# Patient Record
Sex: Male | Born: 1990 | Race: White | Hispanic: No | Marital: Single | Smoking: Current every day smoker
Health system: Southern US, Community
[De-identification: ages and names within clinical notes are randomized; demographics above are authoritative.]

## PROBLEM LIST (undated history)

## (undated) DIAGNOSIS — R569 Unspecified convulsions: Secondary | ICD-10-CM

## (undated) HISTORY — PX: APPENDECTOMY: SHX54

---
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
## 2010-11-16 ENCOUNTER — Emergency Department: Payer: Self-pay | Admitting: Internal Medicine

## 2013-11-25 ENCOUNTER — Emergency Department: Payer: Self-pay | Admitting: Emergency Medicine

## 2014-11-10 ENCOUNTER — Emergency Department (HOSPITAL_COMMUNITY)
Admission: EM | Admit: 2014-11-10 | Discharge: 2014-11-10 | Disposition: A | Discharge status: Home / Self Care | Payer: Self-pay | Attending: Emergency Medicine | Admitting: Emergency Medicine

## 2014-11-10 ENCOUNTER — Encounter (HOSPITAL_COMMUNITY): Payer: Self-pay | Admitting: *Deleted

## 2014-11-10 DIAGNOSIS — Z720 Tobacco use: Secondary | ICD-10-CM | POA: Insufficient documentation

## 2014-11-10 DIAGNOSIS — K047 Periapical abscess without sinus: Secondary | ICD-10-CM | POA: Insufficient documentation

## 2014-11-10 MED ORDER — PENICILLIN V POTASSIUM 500 MG PO TABS: 500.0000 mg | ORAL_TABLET | Freq: Four times a day (QID) | ORAL | Status: AC

## 2014-11-10 MED ORDER — IBUPROFEN 800 MG PO TABS: 800.0000 mg | ORAL_TABLET | Freq: Three times a day (TID) | ORAL | Status: DC | PRN

## 2014-11-10 MED ORDER — HYDROCODONE-ACETAMINOPHEN 5-325 MG PO TABS: 1.0000 | ORAL_TABLET | ORAL | Status: DC | PRN

## 2014-11-10 MED ORDER — HYDROCODONE-ACETAMINOPHEN 5-325 MG PO TABS
1.0000 | ORAL_TABLET | Freq: Once | ORAL | Status: AC
  Administered 2014-11-10: 1 via ORAL
  Filled 2014-11-10: qty 1

## 2014-11-10 NOTE — ED Notes (Signed)
Pt reports he has several broken teeth on Lt side

## 2014-11-10 NOTE — ED Provider Notes (Signed)
CSN: [id number]     Arrival date & time 11/10/14  1432 History  This chart was scribed for non-physician practitioner, Trixie DredgeEmily Meziah Blasingame, PA-C, working with Samuel JesterKathleen McManus, DO, by Modena JanskyAlbert Thayil, ED Scribe. This patient was seen in room TR07C/TR07C and the patient's care was started at 3:25 PM.   Chief Complaint  Patient presents with  . Dental Pain   The history is provided by the patient. No language interpreter was used.   HPI Comments: Benedetto GoadCorey L Tramble is a 24 y.o. male who presents to the Emergency Department complaining of constant moderate left sided dental pain that started 2 days ago. 7/10 intensity He states that the pain started 2 days ago, which he was treating with tylenol and ibuprofen. He reports that the pain worsening this morning and he noted left sided facial swelling.  Has had broken teeth in that area remotely.  Has had subjective fevers and myalgias.  Denies sore throat, difficulty swallowing or breathing.     History reviewed. No pertinent past medical history. History reviewed. No pertinent past surgical history. History reviewed. No pertinent family history. History  Substance Use Topics  . Smoking status: Current Every Day Smoker    Types: Cigarettes  . Smokeless tobacco: Never Used  . Alcohol Use: No    Review of Systems  Constitutional: Positive for fever. Negative for chills.  HENT: Positive for dental problem. Negative for sore throat and trouble swallowing.   Respiratory: Negative for shortness of breath.   Musculoskeletal: Positive for myalgias.  Skin: Negative for color change and wound.  Allergic/Immunologic: Negative for immunocompromised state.  Hematological: Does not bruise/bleed easily.  Psychiatric/Behavioral: Negative for self-injury.    Allergies  Review of patient's allergies indicates not on file.  Home Medications   Prior to Admission medications   Not on File   BP 135/82 mmHg  Temp(Src) 98.2 F (36.8 C) (Oral)  Resp 12  Ht 5\' 9"   (1.753 m)  Wt 135 lb (61.236 kg)  BMI 19.93 kg/m2 Physical Exam  Constitutional: He appears well-developed and well-nourished. No distress.  HENT:  Head: Normocephalic and atraumatic.  Mouth/Throat: Uvula is midline and oropharynx is clear and moist. Mucous membranes are not dry. No uvula swelling. No oropharyngeal exudate, posterior oropharyngeal edema, posterior oropharyngeal erythema or tonsillar abscesses.  Mouth: TTP over several teeth on the left lower side, palplable area of induration and tenderness adjacent to that side.   Neck: Normal range of motion. Neck supple.  Cardiovascular: Normal rate.   Pulmonary/Chest: Effort normal and breath sounds normal. No stridor.  Lymphadenopathy:    He has no cervical adenopathy.  Neurological: He is alert.  Skin: He is not diaphoretic.  Nursing note and vitals reviewed.   ED Course  Procedures (including critical care time) DIAGNOSTIC STUDIES:   COORDINATION OF CARE: 3:29 PM- Pt advised of plan for treatment which includes medication and pt agrees.  Labs Review Labs Reviewed - No data to display  Imaging Review No results found.   EKG Interpretation None      MDM   Final diagnoses:  Dental abscess    Afebrile, nontoxic patient with new dental pain with obvious abscess. No airway concerns. Doubt Ludwig's angina.  D/C home with antibiotic, pain medication and dental follow up.  Discussed findings, treatment, and follow up  with patient.  Pt given return precautions.  Pt verbalizes understanding and agrees with plan.       I personally performed the services described in this documentation, which was  scribed in my presence. The recorded information has been reviewed and is accurate.     Trixie Dredge, PA-C 11/10/14 1550  Samuel Jester, DO 11/11/14 1336

## 2014-11-10 NOTE — Discharge Instructions (Signed)
Read the information below.  Use the prescribed medication as directed.  Please discuss all new medications with your pharmacist.  Do not take additional tylenol while taking the prescribed pain medication to avoid overdose.  You may return to the Emergency Department at any time for worsening condition or any new symptoms that concern you.  Please call the dentist listed above within 48 hours to schedule a close follow up appointment.  If you develop fevers, swelling in your face, difficulty swallowing or breathing, return to the ER immediately for a recheck.   ° ° °Dental Abscess °A dental abscess is a collection of infected fluid (pus) from a bacterial infection in the inner part of the tooth (pulp). It usually occurs at the end of the tooth's root.  °CAUSES  °· Severe tooth decay. °· Trauma to the tooth that allows bacteria to enter into the pulp, such as a broken or chipped tooth. °SYMPTOMS  °· Severe pain in and around the infected tooth. °· Swelling and redness around the abscessed tooth or in the mouth or face. °· Tenderness. °· Pus drainage. °· Bad breath. °· Bitter taste in the mouth. °· Difficulty swallowing. °· Difficulty opening the mouth. °· Nausea. °· Vomiting. °· Chills. °· Swollen neck glands. °DIAGNOSIS  °· A medical and dental history will be taken. °· An examination will be performed by tapping on the abscessed tooth. °· X-rays may be taken of the tooth to identify the abscess. °TREATMENT °The goal of treatment is to eliminate the infection. You may be prescribed antibiotic medicine to stop the infection from spreading. A root canal may be performed to save the tooth. If the tooth cannot be saved, it may be pulled (extracted) and the abscess may be drained.  °HOME CARE INSTRUCTIONS °· Only take over-the-counter or prescription medicines for pain, fever, or discomfort as directed by your caregiver. °· Rinse your mouth (gargle) often with salt water (¼ tsp salt in 8 oz [250 ml] of warm water) to  relieve pain or swelling. °· Do not drive after taking pain medicine (narcotics). °· Do not apply heat to the outside of your face. °· Return to your dentist for further treatment as directed. °SEEK MEDICAL CARE IF: °· Your pain is not helped by medicine. °· Your pain is getting worse instead of better. °SEEK IMMEDIATE MEDICAL CARE IF: °· You have a fever or persistent symptoms for more than 2-3 days. °· You have a fever and your symptoms suddenly get worse. °· You have chills or a very bad headache. °· You have problems breathing or swallowing. °· You have trouble opening your mouth. °· You have swelling in the neck or around the eye. °Document Released: 08/09/2005 Document Revised: 05/03/2012 Document Reviewed: 11/17/2010 °ExitCare® Patient Information ©2015 [employer]. This information is not intended to replace advice given to you by your health care provider. Make sure you discuss any questions you have with your health care provider. ° ° °Emergency Department Resource Guide °1) Find a Doctor and Pay Out of Pocket °Although you won't have to find out who is covered by your insurance plan, it is a good idea to ask around and get recommendations. You will then need to call the office and see if the doctor you have chosen will accept you as a new patient and what types of options they offer for patients who are self-pay. Some doctors offer discounts or will set up payment plans for their patients who do not have insurance, but you   will need to ask so you aren't surprised when you get to your appointment. ° °2) Contact Your Local Health Department °Not all health departments have doctors that can see patients for sick visits, but many do, so it is worth a call to see if yours does. If you don't know where your local health department is, you can check in your phone book. The CDC also has a tool to help you locate your state's health department, and many state websites also have listings of all of their local  health departments. ° °3) Find a Walk-in Clinic °If your illness is not likely to be very severe or complicated, you may want to try a walk in clinic. These are popping up all over the country in pharmacies, drugstores, and shopping centers. They're usually staffed by nurse practitioners or physician assistants that have been trained to treat common illnesses and complaints. They're usually fairly quick and inexpensive. However, if you have serious medical issues or chronic medical problems, these are probably not your best option. ° °No Primary Care Doctor: °- Call [facility] at  832-8000 - they can help you locate a primary care doctor that  accepts your insurance, provides certain services, etc. °- Physician Referral Service- 1-800-533-3463 ° °Chronic Pain Problems: °Organization         Address  Phone   Notes  °[facility]  (336) 297-2271 Patients need to be referred by their primary care doctor.  ° °Medication Assistance: °Organization         Address  Phone   Notes  °[place] [facility] [address] °[place], [place] [address] (336) 641-8030 --Must be a resident of [place] °-- Must have NO insurance coverage whatsoever (no Medicaid/ Medicare, etc.) °-- The pt. MUST have a primary care doctor that directs their care regularly and follows them in the community °  °[facility]  (866) 331-1348   °[employer]  (888) 892-1162   ° °Agencies that provide inexpensive medical care: °Organization         Address  Phone   Notes  °[facility]  (336) 832-8035   °[facility]    (336) 832-7272   °[facility] [address] °[place], [place] [address] (336) 832-4777   °[facility] [address], °[place] (336) 271-4999   °[facility]    (336) 373-0678   °[facility]    (336) 272-1050   °[facility] ° [address], [place] Phone:   (336) 832-4444, Fax:  (336) 832-4440 Hours of Operation:  9 am - 6 pm, M-F.  Also accepts Medicaid/Medicare and self-pay.  °[facility] ° [address], [place] Phone: (336) 832-3150, Fax: (336) 832-3151. Hours of Operation:  8:30 am - 5:30 pm, M-F.  Also accepts Medicaid and self-pay.  °[facility] [place] [address], [place] Phone: (336) 878-6027   °[facility] [address], [place], [place] (336)723-1848, Ext. 123 Mondays & Thursdays: 7-9 AM.  First 15 patients are seen on a first come, first serve basis. °  ° °Medicaid-accepting [place] Providers: ° °Organization         Address  Phone   Notes  °[facility] [address], [place] (336) 641-2100 Also accepts self-pay patients.  °[facility] [address],   [place] ° (336) 856-9996   °[facility] [address], [place] (336) 288-8857   °[facility] [address] [place] [address], [place] (336) 299-7000   °Veita Bland [address], [place]  ° (336) 373-1557 Only accepts [place] Access Medicaid patients after they have their name applied to their card.  ° °Self-Pay (no insurance) in [place]: ° °Organization         Address  Phone   Notes  °Sickle Cell Patients, [facility] [address], [place] (336) 832-1970   °[facility] [address], [place] (336) 832-4400   ° [facility] ° [address], [place] (336) 992-4800   °[facility]/Dr. Osei-Bonsu ° [address] [place] [address], [place] or [address], [place] (336) 841-8500 Phone number for both [place] and [place] locations is the same.  °[facility] [address], [place] (336) 299-0000   °[facility] [address] [place] [address], [place] or [address] (336)  852-7530 °(336) 878-2260   °[facility] [address], [place] (336) 350-1642, phone; (336) 294-5005, fax Sees patients 1st and 3rd Saturday of every month.  Must not qualify for public or private insurance (i.e. Medicaid, Medicare, [place] Health Choice, Veterans' Benefits) • Household income should be no more than 200% of the poverty level •The clinic cannot treat you if you are pregnant or think you are pregnant • Sexually transmitted diseases are not treated at the clinic.  ° ° °Dental Care: °Organization         Address  Phone  Notes  °[facility] [address], [place] (336) 641-6152 Accepts children up to age 21 who are enrolled in Medicaid or [place] Health Choice; pregnant women with a Medicaid card; and children who have applied for Medicaid or [place] Health Choice, but were declined, whose parents can pay a reduced fee at time of service.  °[facility] [place]  [address], [place] (336) 641-7733 Accepts children up to age 21 who are enrolled in Medicaid or [place] Health Choice; pregnant women with a Medicaid card; and children who have applied for Medicaid or [place] Health Choice, but were declined, whose parents can pay a reduced fee at time of service.  °[facility] ° [address], [place] (336) 641-4533 Patients are seen by appointment only. Walk-ins are not accepted. [facility] will see patients 18 years of age and older. °Monday - Tuesday (8am-5pm) °Most Wednesdays (8:30-5pm) °$30 per visit, cash only  °[facility] ° [address], [place] (336) 641-4533 Patients are seen by appointment only. Walk-ins are not accepted. [facility] will see patients 18 years of age and older. °One Wednesday Evening (Monthly: Volunteer Based).  $30 per visit, cash only  °[facility]  (919) 537-3737 for adults;  Children under age 4, call Graduate Pediatric Dentistry at (919) [phone]. Children aged 4-14, please call (919) [phone] to request a pediatric application. ° Dental services are provided in all areas of dental care including fillings, crowns and bridges, complete and partial dentures, implants, gum treatment, root canals, and extractions. Preventive care is also provided. Treatment is provided to both adults and children. °Patients are selected via a lottery and   there is often a waiting list. °  °[facility] [address], °[place] ° (336) 763-8833 [email or web address] °  °[facility] [address], [place], [place] (336)723-1848, Ext. 123 Second and Fourth Thursday of each month, opens at 6:30 AM; Clinic ends at 9 AM.  Patients are seen on a first-come first-served basis, and a limited number are seen during each clinic.  ° °[facility] ° [address], [place], [place] (336) 723-7904   Eligibility Requirements °You must have lived in [place], [place], or [place] counties for at least the last three months. °  You cannot be eligible for state or federal sponsored healthcare insurance, including Veterans Administration, Medicaid, or Medicare. °  You generally cannot be eligible for healthcare insurance through your employer.  °  How to apply: °Eligibility screenings are held every Tuesday and Wednesday afternoon from 1:00 pm until 4:00 pm. You do not need an appointment for the interview!  °[facility] [address], [place], [place] 336-631-2330   °[facility]  336-342-8273   °[facility]  336-703-3100   °[facility]  336-570-6415   ° °Behavioral Health Resources in the Community: °Intensive Outpatient Programs °Organization         Address  Phone  Notes  °[facility] [address], [place], [place] 336-878-6098   °[facility] [address], [place], [place] 336-832-9800   °[facility] [address], [place], [place] ° 336-882-2125   °[facility] [address],  °[place], [place] 1-800-853-5163 or 336-641-4981   °Substance Abuse Resources °Organization         Address  Phone  Notes  °[facility]  336-882-2125   °[facility]  336-784-9470   °[facility]  336-285-9073   °[facility]  336-845-3988   °Residential & Outpatient Substance Abuse Program  1-800-659-3381   °Psychological Services °Organization         Address  Phone  Notes  °[facility]  336- 832-9600   °[facility]  336- 378-7881   °[facility] [address], [place] 1-800-853-5163 or 336-641-4981   ° °Mobile Crisis Teams °Organization         Address  Phone  Notes  °[facility], Mobile Crisis Care Unit  1-877-626-1772   °Assertive °[facility] ° [address] [place], [place] 336-834-9664   °Sharon DeEsch [address] °[place] 336-554-5454   ° °Self-Help/Support Groups °Organization         Address  Phone             Notes  °[facility] - variety of support groups  336- 373-1402 Call for more information  °Narcotics Anonymous (NA), [facility] [address], °[place]  2 meetings at this location  ° °Residential Treatment Programs °Organization         Address  Phone  Notes  °[facility] [address],    °[place]  1-866-801-8205   °[facility] ° [address], [place], [place] 704-293-8524   °[facility] [address], [place] 336-845-3988 Admissions: 8am-3pm M-F  °[facility] [address]    °[place], [place] 336-841-1104   °[facility] [address], [place], [place] 336-379-7146   °  [facility] [address]  °[place], [place] 336-285-9073   °[facility] - Intensive  Outpatient [address], [place], [place] 336-852-3033   °[facility] [address]  °[place], [place] 1-877-615-2722 or 336-784-9470   °[facility] [address] [place], [place] 336-227-7417 Accepts Medicaid  °[facility] [address]  °[place] 1-800-659-3381 Substance Abuse/Addiction Treatment  ° °[place] Behavioral Health Resources °Organization         Address  Phone  Notes  °[facility]  (888) 581-9988   °Julie Brannon, PhD [address] [place], [place]   (336) 349-5553 or (336) 951-0000   °[facility]   [address] °[place], [place] (336) 349-4454   °[facility] [address], [place], [place] (336) 342-8316 Insurance/Medicaid/sponsorship through [facility]  °[facility] [address]                                    [place], [place] (336) 342-8316 Therapy/tele-psych/case  °[facility] [address]  ° [place], [place] (336) 349-2233    °Dr. Arfeen  (336) 349-4544   °[facility] [place]  [employer] [place] [facility] 1) [address], [place] °2) [address], [place] °3)  [address], [place] (336) 349-3220 °(336) 342-7768 ° °(336) 342-8140   °[place] [facility] (336) 342-1394 or (336) 342-3537 (After Hours)    ° ° ° ° °

## 2014-11-10 NOTE — ED Notes (Signed)
Declined W/C at D/C and was escorted to lobby by RN. 

## 2014-12-14 ENCOUNTER — Emergency Department
Admit: 2014-12-14 | Disposition: A | Discharge status: Home / Self Care | Payer: Self-pay | Admitting: Emergency Medicine

## 2017-09-13 ENCOUNTER — Emergency Department
Admission: EM | Admit: 2017-09-13 | Discharge: 2017-09-13 | Discharge status: Against Medical Advice | Payer: Self-pay | Attending: Emergency Medicine | Admitting: Emergency Medicine

## 2017-09-13 ENCOUNTER — Emergency Department: Payer: Self-pay

## 2017-09-13 ENCOUNTER — Encounter: Payer: Self-pay | Admitting: Emergency Medicine

## 2017-09-13 DIAGNOSIS — Z5321 Procedure and treatment not carried out due to patient leaving prior to being seen by health care provider: Secondary | ICD-10-CM | POA: Insufficient documentation

## 2017-09-13 DIAGNOSIS — R079 Chest pain, unspecified: Secondary | ICD-10-CM | POA: Insufficient documentation

## 2017-09-13 LAB — BASIC METABOLIC PANEL
Anion gap: 10 (ref 5–15)
BUN: 8 mg/dL (ref 6–20)
CALCIUM: 9.4 mg/dL (ref 8.9–10.3)
CHLORIDE: 100 mmol/L — AB (ref 101–111)
CO2: 25 mmol/L (ref 22–32)
CREATININE: 0.85 mg/dL (ref 0.61–1.24)
GFR calc non Af Amer: >60 mL/min (ref >60)
Glucose, Bld: 90 mg/dL (ref 65–99)
Potassium: 4 mmol/L (ref 3.5–5.1)
Sodium: 135 mmol/L (ref 135–145)

## 2017-09-13 LAB — TROPONIN I: Troponin I: <0.03 ng/mL (ref <0.03)

## 2017-09-13 LAB — CBC
HCT: 47.4 % (ref 40.0–52.0)
HEMOGLOBIN: 16.4 g/dL (ref 13.0–18.0)
MCH: 31.2 pg (ref 26.0–34.0)
MCHC: 34.7 g/dL (ref 32.0–36.0)
MCV: 89.9 fL (ref 80.0–100.0)
Platelets: 197 10*3/uL (ref 150–440)
RBC: 5.27 MIL/uL (ref 4.40–5.90)
RDW: 14 % (ref 11.5–14.5)
WBC: 10.3 10*3/uL (ref 3.8–10.6)

## 2017-09-13 NOTE — ED Notes (Signed)
Pt reports leaving now; pt informed that he needs to stay and be evaluated and that he will be going to the next available exam room; pt cont to stay he is leaving; st his friend is in an exam room and being discharge and is leaving now with him; instr to return for new or worsening symptoms

## 2017-09-13 NOTE — ED Triage Notes (Signed)
Pt presents with chest pain since last night. He and a friend injected meth last night. He now has chest pain, neck pain, flank pain. Pt also reports that his eyes are swollen. Pt alert & oriented with NAD noted.

## 2017-09-14 ENCOUNTER — Telehealth: Payer: Self-pay | Admitting: Emergency Medicine

## 2017-09-14 NOTE — Telephone Encounter (Signed)
Called patient due to lwot to inquire about condition and follow up plans. Number is disconnected and second number sounds like a fax

## 2020-03-10 ENCOUNTER — Emergency Department
Admission: EM | Admit: 2020-03-10 | Discharge: 2020-03-10 | Disposition: A | Discharge status: Home / Self Care | Payer: Self-pay | Attending: Emergency Medicine | Admitting: Emergency Medicine

## 2020-03-10 ENCOUNTER — Other Ambulatory Visit: Payer: Self-pay

## 2020-03-10 DIAGNOSIS — Z0000 Encounter for general adult medical examination without abnormal findings: Secondary | ICD-10-CM | POA: Insufficient documentation

## 2020-03-10 DIAGNOSIS — Z5321 Procedure and treatment not carried out due to patient leaving prior to being seen by health care provider: Secondary | ICD-10-CM | POA: Insufficient documentation

## 2020-03-10 LAB — COMPREHENSIVE METABOLIC PANEL
ALT: 58 U/L — ABNORMAL HIGH (ref 0–44)
AST: 33 U/L (ref 15–41)
Albumin: 5.2 g/dL — ABNORMAL HIGH (ref 3.5–5.0)
Alkaline Phosphatase: 95 U/L (ref 38–126)
Anion gap: 17 — ABNORMAL HIGH (ref 5–15)
BUN: 25 mg/dL — ABNORMAL HIGH (ref 6–20)
CO2: 20 mmol/L — ABNORMAL LOW (ref 22–32)
Calcium: 9.7 mg/dL (ref 8.9–10.3)
Chloride: 100 mmol/L (ref 98–111)
Creatinine, Ser: 1.31 mg/dL — ABNORMAL HIGH (ref 0.61–1.24)
GFR calc Af Amer: >60 mL/min (ref >60)
GFR calc non Af Amer: >60 mL/min (ref >60)
Glucose, Bld: 80 mg/dL (ref 70–99)
Potassium: 4.5 mmol/L (ref 3.5–5.1)
Sodium: 137 mmol/L (ref 135–145)
Total Bilirubin: 2.2 mg/dL — ABNORMAL HIGH (ref 0.3–1.2)
Total Protein: 8.3 g/dL — ABNORMAL HIGH (ref 6.5–8.1)

## 2020-03-10 LAB — ETHANOL: Alcohol, Ethyl (B): <10 mg/dL (ref <10)

## 2020-03-10 LAB — CBC
HCT: 49.3 % (ref 39.0–52.0)
Hemoglobin: 16.6 g/dL (ref 13.0–17.0)
MCH: 31 pg (ref 26.0–34.0)
MCHC: 33.7 g/dL (ref 30.0–36.0)
MCV: 92.1 fL (ref 80.0–100.0)
Platelets: 275 10*3/uL (ref 150–400)
RBC: 5.35 MIL/uL (ref 4.22–5.81)
RDW: 13.2 % (ref 11.5–15.5)
WBC: 22.4 10*3/uL — ABNORMAL HIGH (ref 4.0–10.5)
nRBC: 0 % (ref 0.0–0.2)

## 2020-03-10 NOTE — ED Notes (Signed)
Pt called from lobby to be taken back to exam room. Located pt walking across the parking lot. Pt name called loudly but he continued walking after turning around and acknowledging staff.

## 2020-03-10 NOTE — ED Triage Notes (Signed)
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 Denies SI/HI/AVH. Last used this morning around 2am.

## 2020-03-10 NOTE — ED Notes (Signed)
Pt voluntary and does not need to be dressed out at this time. First Nurse RN aware.

## 2020-03-29 ENCOUNTER — Encounter: Payer: Self-pay | Admitting: Emergency Medicine

## 2020-03-29 ENCOUNTER — Emergency Department
Admission: EM | Admit: 2020-03-29 | Discharge: 2020-03-29 | Disposition: A | Discharge status: Home / Self Care | Payer: Self-pay | Attending: Emergency Medicine | Admitting: Emergency Medicine

## 2020-03-29 ENCOUNTER — Other Ambulatory Visit: Payer: Self-pay

## 2020-03-29 DIAGNOSIS — N4829 Other inflammatory disorders of penis: Secondary | ICD-10-CM | POA: Insufficient documentation

## 2020-03-29 DIAGNOSIS — N4889 Other specified disorders of penis: Secondary | ICD-10-CM

## 2020-03-29 DIAGNOSIS — F17210 Nicotine dependence, cigarettes, uncomplicated: Secondary | ICD-10-CM | POA: Insufficient documentation

## 2020-03-29 LAB — CHLAMYDIA/NGC RT PCR (ARMC ONLY)
Chlamydia Tr: NOT DETECTED
N gonorrhoeae: NOT DETECTED

## 2020-03-29 LAB — URINALYSIS, COMPLETE (UACMP) WITH MICROSCOPIC
Bacteria, UA: NONE SEEN
Bilirubin Urine: NEGATIVE
Glucose, UA: NEGATIVE mg/dL
Hgb urine dipstick: NEGATIVE
Ketones, ur: NEGATIVE mg/dL
Leukocytes,Ua: NEGATIVE
Nitrite: NEGATIVE
Protein, ur: NEGATIVE mg/dL
Specific Gravity, Urine: 1.009 (ref 1.005–1.030)
pH: 7 (ref 5.0–8.0)

## 2020-03-29 MED ORDER — NEOSPORIN PLUS PAIN RELIEF MS 3.5-10000-10 EX CREA
TOPICAL_CREAM | Freq: Two times a day (BID) | CUTANEOUS | 0 refills | Status: AC
Start: 2020-03-29 — End: ?

## 2020-03-29 NOTE — ED Provider Notes (Signed)
[facility] Emergency Department Provider Note   ____________________________________________   First MD Initiated Contact with Patient 03/29/20 1229     (approximate)  I have reviewed the triage vital signs and the nursing notes.   HISTORY  Chief Complaint STD testing    HPI Noah Patel is a 29 y.o. male patient requests STD testing secondary to penile irritation.  Patient state secondary to to unprotected sexual encounters yesterday he awakened with redness to the penis.  Patient denies urethral discharge.  Patient denies dysuria or hematuria.  Patient denies pain.         History reviewed. No pertinent past medical history.  There are no problems to display for this patient.   History reviewed. No pertinent surgical history.  Prior to Admission medications   Medication Sig Start Date End Date Taking? Authorizing Provider  HYDROcodone-acetaminophen (NORCO/VICODIN) 5-325 MG per tablet Take 1 tablet by mouth every 4 (four) hours as needed for moderate pain or severe pain. 11/10/14   Trixie Dredge, PA-C  ibuprofen (ADVIL,MOTRIN) 800 MG tablet Take 1 tablet (800 mg total) by mouth every 8 (eight) hours as needed for mild pain or moderate pain. 11/10/14   Trixie Dredge, PA-C  neomycin-polymyxin-pramoxine (NEOSPORIN PLUS) 1 % cream Apply topically 2 (two) times daily. 03/29/20   Joni Reining, PA-C    Allergies Patient has no known allergies.  No family history on file.  Social History Social History   Tobacco Use  . Smoking status: Current Every Day Smoker    Packs/day: 1.00    Types: Cigarettes  . Smokeless tobacco: Never Used  Substance Use Topics  . Alcohol use: Yes    Alcohol/week: 24.0 standard drinks    Types: 24 Cans of beer per week  . Drug use: Yes    Types: Methamphetamines, IV    Comment: last used in July     Review of Systems Constitutional: No fever/chills Eyes: No visual changes. ENT: No sore throat. Cardiovascular:  Denies chest pain. Respiratory: Denies shortness of breath. Gastrointestinal: No abdominal pain.  No nausea, no vomiting.  No diarrhea.  No constipation. Genitourinary: Negative for dysuria. Musculoskeletal: Negative for back pain. Skin: Erythematous glans penis. Neurological: Negative for headaches, focal weakness or numbness.   ____________________________________________   PHYSICAL EXAM:  VITAL SIGNS: ED Triage Vitals  Enc Vitals Group     BP 03/29/20 1207 118/65     Pulse Rate 03/29/20 1207 82     Resp 03/29/20 1207 16     Temp 03/29/20 1207 98.7 F (37.1 C)     Temp Source 03/29/20 1207 Oral     SpO2 03/29/20 1207 100 %     Weight 03/29/20 1208 147 lb (66.7 kg)     Height 03/29/20 1208 5\' 9"  (1.753 m)     Head Circumference --      Peak Flow --      Pain Score 03/29/20 1208 0     Pain Loc --      Pain Edu? --      Excl. in GC? --    Constitutional: Alert and oriented. Well appearing and in no acute distress. Cardiovascular: Normal rate, regular rhythm. Grossly normal heart sounds.  Good peripheral circulation. Respiratory: Normal respiratory effort.  No retractions. Lungs CTAB. Genitourinary: No lesions or urethral discharge. Neurologic:  Normal speech and language. No gross focal neurologic deficits are appreciated. No gait instability. Skin: Mild erythematous to the glans penis.   Psychiatric: Mood and affect  are normal. Speech and behavior are normal.  ____________________________________________   LABS (all labs ordered are listed, but only abnormal results are displayed)  Labs Reviewed  URINALYSIS, COMPLETE (UACMP) WITH MICROSCOPIC - Abnormal; Notable for the following components:      Result Value   Color, Urine YELLOW (*)    APPearance CLEAR (*)    All other components within normal limits  CHLAMYDIA/NGC RT PCR ([facility] ONLY)  RPR    ____________________________________________  EKG   ____________________________________________  RADIOLOGY  ED MD interpretation:    Official radiology report(s): No results found.  ____________________________________________   PROCEDURES  Procedure(s) performed (including Critical Care):  Procedures   ____________________________________________   INITIAL IMPRESSION / ASSESSMENT AND PLAN / ED COURSE  As part of my medical decision making, I reviewed the following data within the electronic MEDICAL RECORD NUMBER     Patient presents with redness to the glans of the penis status post sexual activity yesterday.  No lesions or urethral discharge.  Urinalysis grossly unremarkable.  Results for chlamydia, gonorrhea, and syphilis are pending.  Patient advised he will be notified telephonically if test positive.  He may also seek further evaluation at the [facility] health department.  Advise applied Neosporin for irritation.    Noah Patel was evaluated in Emergency Department on 03/29/2020 for the symptoms described in the history of present illness. He was evaluated in the context of the global COVID-19 pandemic, which necessitated consideration that the patient might be at risk for infection with the SARS-CoV-2 virus that causes COVID-19. Institutional protocols and algorithms that pertain to the evaluation of patients at risk for COVID-19 are in a state of rapid change based on information released by regulatory bodies including the CDC and federal and state organizations. These policies and algorithms were followed during the patient's care in the ED.       ____________________________________________   FINAL CLINICAL IMPRESSION(S) / ED DIAGNOSES  Final diagnoses:  Penile irritation     ED Discharge Orders         Ordered    neomycin-polymyxin-pramoxine (NEOSPORIN PLUS) 1 % cream  2 times daily     Discontinue  Reprint     03/29/20 1332           Note:  This  document was prepared using Dragon voice recognition software and may include unintentional dictation errors.    Joni Reining, PA-C 03/29/20 1338    Gilles Chiquito, MD 03/29/20 [phone]

## 2020-03-29 NOTE — ED Triage Notes (Signed)
Pt to ED via POV stating that he wants to get tested for STD. Pt denies penile discharge but states that his penis is red. Pt is in NAD.

## 2020-03-29 NOTE — Discharge Instructions (Addendum)
Your screening for STD was unremarkable.  Test results are still pending.  Advised to apply Neosporin gland the penis twice a day for 3 to 5 days.  Advised use of a condom for sexual activity until condition clears.  Any positive test will be telephonically relayed to you.

## 2020-03-30 LAB — RPR: RPR Ser Ql: NONREACTIVE

## 2020-04-27 ENCOUNTER — Other Ambulatory Visit: Payer: Self-pay

## 2020-04-27 DIAGNOSIS — Y999 Unspecified external cause status: Secondary | ICD-10-CM | POA: Insufficient documentation

## 2020-04-27 DIAGNOSIS — M546 Pain in thoracic spine: Secondary | ICD-10-CM | POA: Insufficient documentation

## 2020-04-27 DIAGNOSIS — X58XXXA Exposure to other specified factors, initial encounter: Secondary | ICD-10-CM | POA: Insufficient documentation

## 2020-04-27 DIAGNOSIS — S069X9A Unspecified intracranial injury with loss of consciousness of unspecified duration, initial encounter: Secondary | ICD-10-CM | POA: Insufficient documentation

## 2020-04-27 DIAGNOSIS — R42 Dizziness and giddiness: Secondary | ICD-10-CM | POA: Insufficient documentation

## 2020-04-27 DIAGNOSIS — Z5321 Procedure and treatment not carried out due to patient leaving prior to being seen by health care provider: Secondary | ICD-10-CM | POA: Insufficient documentation

## 2020-04-27 DIAGNOSIS — Y939 Activity, unspecified: Secondary | ICD-10-CM | POA: Insufficient documentation

## 2020-04-27 DIAGNOSIS — Y9289 Other specified places as the place of occurrence of the external cause: Secondary | ICD-10-CM | POA: Insufficient documentation

## 2020-04-27 LAB — URINALYSIS, COMPLETE (UACMP) WITH MICROSCOPIC
Bacteria, UA: NONE SEEN
Bilirubin Urine: NEGATIVE
Glucose, UA: NEGATIVE mg/dL
Hgb urine dipstick: NEGATIVE
Ketones, ur: 20 mg/dL — AB
Leukocytes,Ua: NEGATIVE
Nitrite: NEGATIVE
Protein, ur: 100 mg/dL — AB
Specific Gravity, Urine: 1.021 (ref 1.005–1.030)
Squamous Epithelial / HPF: NONE SEEN (ref 0–5)
pH: 6 (ref 5.0–8.0)

## 2020-04-27 LAB — CBC
HCT: 48.8 % (ref 39.0–52.0)
Hemoglobin: 16.7 g/dL (ref 13.0–17.0)
MCH: 31.2 pg (ref 26.0–34.0)
MCHC: 34.2 g/dL (ref 30.0–36.0)
MCV: 91 fL (ref 80.0–100.0)
Platelets: 293 10*3/uL (ref 150–400)
RBC: 5.36 MIL/uL (ref 4.22–5.81)
RDW: 13 % (ref 11.5–15.5)
WBC: 17.8 10*3/uL — ABNORMAL HIGH (ref 4.0–10.5)
nRBC: 0 % (ref 0.0–0.2)

## 2020-04-27 LAB — BASIC METABOLIC PANEL
Anion gap: 10 (ref 5–15)
BUN: 9 mg/dL (ref 6–20)
CO2: 26 mmol/L (ref 22–32)
Calcium: 9.6 mg/dL (ref 8.9–10.3)
Chloride: 103 mmol/L (ref 98–111)
Creatinine, Ser: 0.72 mg/dL (ref 0.61–1.24)
GFR calc Af Amer: >60 mL/min (ref >60)
GFR calc non Af Amer: >60 mL/min (ref >60)
Glucose, Bld: 99 mg/dL (ref 70–99)
Potassium: 3.9 mmol/L (ref 3.5–5.1)
Sodium: 139 mmol/L (ref 135–145)

## 2020-04-27 NOTE — ED Triage Notes (Signed)
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

## 2020-04-28 ENCOUNTER — Emergency Department
Admission: EM | Admit: 2020-04-28 | Discharge: 2020-04-28 | Disposition: A | Discharge status: Home / Self Care | Payer: Self-pay | Attending: Emergency Medicine | Admitting: Emergency Medicine

## 2020-09-14 ENCOUNTER — Emergency Department
Admission: EM | Admit: 2020-09-14 | Discharge: 2020-09-14 | Disposition: A | Discharge status: Home / Self Care | Payer: Self-pay | Attending: Emergency Medicine | Admitting: Emergency Medicine

## 2020-09-14 ENCOUNTER — Emergency Department: Payer: Self-pay

## 2020-09-14 ENCOUNTER — Other Ambulatory Visit: Payer: Self-pay

## 2020-09-14 DIAGNOSIS — F13939 Sedative, hypnotic or anxiolytic use, unspecified with withdrawal, unspecified: Secondary | ICD-10-CM

## 2020-09-14 DIAGNOSIS — F13239 Sedative, hypnotic or anxiolytic dependence with withdrawal, unspecified: Secondary | ICD-10-CM | POA: Insufficient documentation

## 2020-09-14 DIAGNOSIS — F17210 Nicotine dependence, cigarettes, uncomplicated: Secondary | ICD-10-CM | POA: Insufficient documentation

## 2020-09-14 HISTORY — DX: Unspecified convulsions: R56.9

## 2020-09-14 LAB — CBC
HCT: 48.5 % (ref 39.0–52.0)
Hemoglobin: 16.3 g/dL (ref 13.0–17.0)
MCH: 31.1 pg (ref 26.0–34.0)
MCHC: 33.6 g/dL (ref 30.0–36.0)
MCV: 92.6 fL (ref 80.0–100.0)
Platelets: 256 10*3/uL (ref 150–400)
RBC: 5.24 MIL/uL (ref 4.22–5.81)
RDW: 13.2 % (ref 11.5–15.5)
WBC: 9.9 10*3/uL (ref 4.0–10.5)
nRBC: 0 % (ref 0.0–0.2)

## 2020-09-14 LAB — BASIC METABOLIC PANEL
Anion gap: 10 (ref 5–15)
BUN: 12 mg/dL (ref 6–20)
CO2: 24 mmol/L (ref 22–32)
Calcium: 9.6 mg/dL (ref 8.9–10.3)
Chloride: 105 mmol/L (ref 98–111)
Creatinine, Ser: 1.01 mg/dL (ref 0.61–1.24)
GFR, Estimated: >60 mL/min (ref >60)
Glucose, Bld: 137 mg/dL — ABNORMAL HIGH (ref 70–99)
Potassium: 3.8 mmol/L (ref 3.5–5.1)
Sodium: 139 mmol/L (ref 135–145)

## 2020-09-14 LAB — URINE DRUG SCREEN, QUALITATIVE (ARMC ONLY)
Amphetamines, Ur Screen: NOT DETECTED
Barbiturates, Ur Screen: NOT DETECTED
Benzodiazepine, Ur Scrn: POSITIVE — AB
Cannabinoid 50 Ng, Ur ~~LOC~~: POSITIVE — AB
Cocaine Metabolite,Ur ~~LOC~~: POSITIVE — AB
MDMA (Ecstasy)Ur Screen: NOT DETECTED
Methadone Scn, Ur: NOT DETECTED
Opiate, Ur Screen: NOT DETECTED
Phencyclidine (PCP) Ur S: NOT DETECTED
Tricyclic, Ur Screen: NOT DETECTED

## 2020-09-14 LAB — CBG MONITORING, ED: Glucose-Capillary: 111 mg/dL — ABNORMAL HIGH (ref 70–99)

## 2020-09-14 MED ORDER — CHLORDIAZEPOXIDE HCL 25 MG PO CAPS
ORAL_CAPSULE | ORAL | 0 refills | Status: AC
Start: 2020-09-14 — End: 2020-09-18

## 2020-09-14 NOTE — ED Notes (Signed)
Pt reports takes xanax and ETOH on regular basis. Last consumption of both alcohol and xanax was 2 days ago. Reports has had 1 seizure in the past related to xanax withdrawal

## 2020-09-14 NOTE — ED Notes (Signed)
Patient transported to X-ray 

## 2020-09-14 NOTE — ED Provider Notes (Signed)
[facility] Emergency Department Provider Note   ____________________________________________    I have reviewed the triage vital signs and the nursing notes.   HISTORY  Chief Complaint Seizures     HPI Noah Patel is a 30 y.o. male who presents after a reported seizure today.  Patient reports he was at work, was feeling okay and next thing he was waking up on the floor.  His coworkers told him that he had a seizure.  He denies a history of seizures to me.  He does admit to a history of taking Xanax and stopping cold [place] 2 days ago.  He reports he was having significant sweating after the seizure like event but is feeling at baseline now.  Past Medical History:  Diagnosis Date  . Seizures (HCC)     There are no problems to display for this patient.   Past Surgical History:  Procedure Laterality Date  . APPENDECTOMY      Prior to Admission medications   Medication Sig Start Date End Date Taking? Authorizing Provider  chlordiazePOXIDE (LIBRIUM) 25 MG capsule Take 2 capsules (50 mg total) by mouth in the morning and at bedtime for 1 day, THEN 1 capsule (25 mg total) 4 (four) times daily for 1 day, THEN 1 capsule (25 mg total) in the morning and at bedtime for 1 day, THEN 1 capsule (25 mg total) at bedtime for 1 day. 09/14/20 09/18/20 Yes Jene Every, MD  HYDROcodone-acetaminophen (NORCO/VICODIN) 5-325 MG per tablet Take 1 tablet by mouth every 4 (four) hours as needed for moderate pain or severe pain. 11/10/14   Trixie Dredge, PA-C  ibuprofen (ADVIL,MOTRIN) 800 MG tablet Take 1 tablet (800 mg total) by mouth every 8 (eight) hours as needed for mild pain or moderate pain. 11/10/14   Trixie Dredge, PA-C  neomycin-polymyxin-pramoxine (NEOSPORIN PLUS) 1 % cream Apply topically 2 (two) times daily. 03/29/20   Joni Reining, PA-C     Allergies Patient has no known allergies.  History reviewed. No pertinent family history.  Social History Social  History   Tobacco Use  . Smoking status: Current Every Day Smoker    Packs/day: 1.00    Types: Cigarettes  . Smokeless tobacco: Never Used  Substance Use Topics  . Alcohol use: Yes    Alcohol/week: 24.0 standard drinks    Types: 24 Cans of beer per week  . Drug use: Yes    Types: Methamphetamines, IV    Comment: last used in July     Review of Systems  Constitutional: No fever/chills Eyes: No visual changes.  ENT: No sore throat. Cardiovascular: Denies chest pain. Respiratory: Denies shortness of breath. Gastrointestinal: No abdominal pain.    Genitourinary: Negative for dysuria. Musculoskeletal: Negative for back pain. Skin: As above Neurological: Negative for headaches or weakness   ____________________________________________   PHYSICAL EXAM:  VITAL SIGNS: ED Triage Vitals  Enc Vitals Group     BP 09/14/20 0936 118/67     Pulse Rate 09/14/20 0936 98     Resp 09/14/20 0936 18     Temp 09/14/20 0936 97.8 F (36.6 C)     Temp Source 09/14/20 0936 Oral     SpO2 09/14/20 0936 96 %     Weight 09/14/20 0936 63.5 kg (140 lb)     Height 09/14/20 0936 1.753 m (5\' 9" )     Head Circumference --      Peak Flow --      Pain Score  09/14/20 0946 4     Pain Loc --      Pain Edu? --      Excl. in GC? --     Constitutional: Alert and oriented.   Nose: No congestion/rhinnorhea. Mouth/Throat: Mucous membranes are moist.    Cardiovascular: Normal rate, regular rhythm. Grossly normal heart sounds.  Good peripheral circulation. Respiratory: Normal respiratory effort.  No retractions. Lungs CTAB. Gastrointestinal: Soft and nontender. No distention.  No CVA tenderness.  Musculoskeletal: Bruising, mild swelling and tenderness over the left [facility] joint Neurologic:  Normal speech and language. No gross focal neurologic deficits are appreciated.  Skin:  Skin is warm, dry and intact. No rash noted. Psychiatric: Mood and affect are normal. Speech and behavior are  normal.  ____________________________________________   LABS (all labs ordered are listed, but only abnormal results are displayed)  Labs Reviewed  BASIC METABOLIC PANEL - Abnormal; Notable for the following components:      Result Value   Glucose, Bld 137 (*)    All other components within normal limits  CBG MONITORING, ED - Abnormal; Notable for the following components:   Glucose-Capillary 111 (*)    All other components within normal limits  URINE CULTURE  CBC  URINE DRUG SCREEN, QUALITATIVE ([facility] ONLY)  CBG MONITORING, ED   ____________________________________________  EKG  ED ECG REPORT I, Jene Every, the attending physician, personally viewed and interpreted this ECG.  Date: 09/14/2020  Rhythm: normal sinus rhythm QRS Axis: normal Intervals: normal ST/T Wave abnormalities: Nonspecific ST change Narrative Interpretation: no evidence of acute ischemia  ____________________________________________  RADIOLOGY  CT head reviewed by me, unremarkable ____________________________________________   PROCEDURES  Procedure(s) performed: No  Procedures   Critical Care performed: No ____________________________________________   INITIAL IMPRESSION / ASSESSMENT AND PLAN / ED COURSE  Pertinent labs & imaging results that were available during my care of the patient were reviewed by me and considered in my medical decision making (see chart for details).  Patient presents after seizure-like event.  Given history of recent benzo usage with abrupt discontinuation, consistent with benzo withdrawal seizure.  Patient is well-appearing here and in no acute distress.  He does have tenderness over his left AC joint where he may have injured his shoulder when he fell.  Head CT is reassuring, lab work is unremarkable.  Patient does not want to be admitted to the hospital so we will consider Librium taper but with very strict return precautions     ____________________________________________   FINAL CLINICAL IMPRESSION(S) / ED DIAGNOSES  Final diagnoses:  Benzodiazepine withdrawal with complication [facility])        Note:  This document was prepared using Dragon voice recognition software and may include unintentional dictation errors.   Jene Every, MD 09/14/20 [phone]

## 2020-09-14 NOTE — ED Notes (Signed)
First Nurse Note: Pt to ED via [facility] from work for syncopal episode with possible seizure like activity. Pt is having left shoulder pain and pt has some dried blood on the side of his mouth. Pt is A & O. Pt denies hx/o seizure.

## 2020-09-14 NOTE — ED Triage Notes (Signed)
Pt was working at [employer] and had witnessed possible seizure like activity and syncopal episode. Pt has scrape to left shoulder and lump on left cheekbone and near eye. Pt sweating profusely. Pt alert and oriented at present,

## 2020-09-16 LAB — URINE CULTURE: Culture: <10000 — AB

## 2020-10-02 ENCOUNTER — Emergency Department: Payer: Self-pay

## 2020-10-02 ENCOUNTER — Other Ambulatory Visit: Payer: Self-pay

## 2020-10-02 ENCOUNTER — Emergency Department
Admission: EM | Admit: 2020-10-02 | Discharge: 2020-10-02 | Disposition: A | Discharge status: Home / Self Care | Payer: Self-pay | Attending: Emergency Medicine | Admitting: Emergency Medicine

## 2020-10-02 DIAGNOSIS — X509XXA Other and unspecified overexertion or strenuous movements or postures, initial encounter: Secondary | ICD-10-CM | POA: Insufficient documentation

## 2020-10-02 DIAGNOSIS — F17210 Nicotine dependence, cigarettes, uncomplicated: Secondary | ICD-10-CM | POA: Insufficient documentation

## 2020-10-02 DIAGNOSIS — S4352XA Sprain of left acromioclavicular joint, initial encounter: Secondary | ICD-10-CM | POA: Insufficient documentation

## 2020-10-02 NOTE — ED Provider Notes (Signed)
[facility] EMERGENCY DEPARTMENT Provider Note   CSN: [id number] Arrival date & time: 10/02/20  1833     History Chief Complaint  Patient presents with  . Arm Pain    Noah Patel is a 30 y.o. male presents to the emergency department evaluation of left shoulder pain.  Several weeks ago had a seizure, injured his left shoulder x-rays taken and were negative.  He said persistent pain along the left [facility] joint with some shifting and feeling of motion at the joint.  He skipped work yesterday due to the pain.  He has been taking Tylenol and ibuprofen.  Pain is moderate to severe.  Denies any neck pain numbness tingling radicular symptoms.  HPI     Past Medical History:  Diagnosis Date  . Seizures (HCC)     There are no problems to display for this patient.   Past Surgical History:  Procedure Laterality Date  . APPENDECTOMY         No family history on file.  Social History   Tobacco Use  . Smoking status: Current Every Day Smoker    Packs/day: 1.00    Types: Cigarettes  . Smokeless tobacco: Never Used  Substance Use Topics  . Alcohol use: Yes    Alcohol/week: 24.0 standard drinks    Types: 24 Cans of beer per week  . Drug use: Yes    Types: Methamphetamines, IV    Comment: last used in July     Home Medications Prior to Admission medications   Medication Sig Start Date End Date Taking? Authorizing Provider  HYDROcodone-acetaminophen (NORCO/VICODIN) 5-325 MG per tablet Take 1 tablet by mouth every 4 (four) hours as needed for moderate pain or severe pain. 11/10/14   Trixie Dredge, PA-C  ibuprofen (ADVIL,MOTRIN) 800 MG tablet Take 1 tablet (800 mg total) by mouth every 8 (eight) hours as needed for mild pain or moderate pain. 11/10/14   Trixie Dredge, PA-C  neomycin-polymyxin-pramoxine (NEOSPORIN PLUS) 1 % cream Apply topically 2 (two) times daily. 03/29/20   Joni Reining, PA-C    Allergies    Patient has no known allergies.  Review of  Systems   Review of Systems  Constitutional: Negative for fever.  Respiratory: Negative for shortness of breath.   Cardiovascular: Negative for chest pain.  Gastrointestinal: Negative for nausea and vomiting.  Musculoskeletal: Positive for arthralgias. Negative for joint swelling, myalgias and neck pain.  Skin: Negative for rash and wound.  Neurological: Negative for dizziness and headaches.    Physical Exam Updated Vital Signs BP 131/79 (BP Location: Left Arm)   Pulse 63   Temp 98.5 F (36.9 C) (Oral)   Resp 16   SpO2 98%   Physical Exam Constitutional:      Appearance: He is well-developed and well-nourished.  HENT:     Head: Normocephalic and atraumatic.  Eyes:     Conjunctiva/sclera: Conjunctivae normal.  Cardiovascular:     Rate and Rhythm: Normal rate.  Pulmonary:     Effort: Pulmonary effort is normal. No respiratory distress.  Musculoskeletal:        General: Normal range of motion.     Cervical back: Normal range of motion.     Comments: No cervical spinous process tenderness.  Nontender along the clavicle but tenderness along the acromion at the [facility] joint with mild soft tissue swelling noted.  Good positioning of the shoulder with no pain with internal or external rotation.  He has normal active abduction and  flexion of the left shoulder with some pain along the superior aspect of the AC joint.  He is neurovascular tact in left upper extremity.  Skin:    General: Skin is warm.     Findings: No rash.  Neurological:     General: No focal deficit present.     Mental Status: He is alert and oriented to person, place, and time.  Psychiatric:        Mood and Affect: Mood and affect normal.        Behavior: Behavior normal.        Thought Content: Thought content normal.     ED Results / Procedures / Treatments   Labs (all labs ordered are listed, but only abnormal results are displayed) Labs Reviewed - No data to display  EKG None  Radiology DG Shoulder  Left  Result Date: 10/02/2020 CLINICAL DATA:  Tenderness and swelling at [facility] joint EXAM: LEFT SHOULDER - 2+ VIEW COMPARISON:  None. FINDINGS: There is no evidence of fracture or dislocation. There is no evidence of arthropathy or other focal bone abnormality. There is soft tissue swelling seen at the superior portion of the [facility] joint which is slightly incongruous. No AC joint widening however is noted. IMPRESSION: Findings which may be suggestive of grade 1 AC joint injury with mild soft tissue swelling. Electronically Signed   By: Jonna Clark M.D.   On: 10/02/2020 21:14    Procedures Procedures   Medications Ordered in ED Medications - No data to display  ED Course  I have reviewed the triage vital signs and the nursing notes.  Pertinent labs & imaging results that were available during my care of the patient were reviewed by me and considered in my medical decision making (see chart for details).    MDM Rules/Calculators/A&P                          30 year old male with left shoulder pain.  Point tenderness along the acromion with mild soft tissue swelling.  X-rays reviewed from previous visit showed some concern for possible nondisplaced acromial fracture which is consistent with exam findings.  X-rays today show no evidence of acute bony abnormality.  Symptoms and exam findings consistent with mild AC sprain.  We will have him rest and ice the left shoulder.  Continue with Tylenol and ibuprofen as needed for pain.  He is given a note for work and will follow up in 2 to 3 weeks with orthopedics.  He understands signs symptoms return to the ER for. Final Clinical Impression(s) / ED Diagnoses Final diagnoses:  Acromioclavicular sprain, left, initial encounter    Rx / DC Orders ED Discharge Orders    None       Ronnette Juniper 10/02/20 2129    Chesley Noon, MD 10/02/20 2206

## 2020-10-02 NOTE — ED Triage Notes (Signed)
Pt comes via POV from home with c/o left arm and shoulder pain. Pt states he had a seizure few weeks ago and injured his arm. Pt states he feels like it popped out.

## 2020-10-02 NOTE — Discharge Instructions (Signed)
Please rest and ice the left shoulder.  Gentle range of motion as tolerated.  Continue with Tylenol and ibuprofen and follow-up with orthopedics in 2 to 3 weeks if persistent pain.

## 2021-01-26 ENCOUNTER — Other Ambulatory Visit: Payer: Self-pay

## 2021-01-26 ENCOUNTER — Emergency Department
Admission: EM | Admit: 2021-01-26 | Discharge: 2021-01-26 | Disposition: A | Discharge status: Home / Self Care | Payer: Self-pay | Attending: Emergency Medicine | Admitting: Emergency Medicine

## 2021-01-26 DIAGNOSIS — F17210 Nicotine dependence, cigarettes, uncomplicated: Secondary | ICD-10-CM | POA: Insufficient documentation

## 2021-01-26 DIAGNOSIS — K047 Periapical abscess without sinus: Secondary | ICD-10-CM | POA: Insufficient documentation

## 2021-01-26 DIAGNOSIS — K0889 Other specified disorders of teeth and supporting structures: Secondary | ICD-10-CM

## 2021-01-26 MED ORDER — IBUPROFEN 600 MG PO TABS: 600.0000 mg | ORAL_TABLET | Freq: Four times a day (QID) | ORAL | 0 refills | Status: DC | PRN

## 2021-01-26 MED ORDER — AMOXICILLIN-POT CLAVULANATE 875-125 MG PO TABS: 1.0000 | ORAL_TABLET | Freq: Two times a day (BID) | ORAL | 0 refills | Status: AC

## 2021-01-26 MED ORDER — TRAMADOL HCL 50 MG PO TABS: 50.0000 mg | ORAL_TABLET | Freq: Four times a day (QID) | ORAL | 0 refills | Status: DC | PRN

## 2021-01-26 NOTE — ED Triage Notes (Signed)
Pt reports having left lower jaw swelling for the past 2 days, states no dental pain but reports that he has broken tooth on the bottom left

## 2021-01-26 NOTE — ED Provider Notes (Signed)
[facility] EMERGENCY DEPARTMENT Provider Note   CSN: [id number] Arrival date & time: 01/26/21  1604     History Chief Complaint  Patient presents with  . Oral Swelling    Noah Patel is a 30 y.o. male.  Presents to the emergency department evaluation of left lower jaw swelling.  Symptoms been present for 2 days.  No trauma or injury.  Patient has bad dental decay, history of fractured tooth and states over the last 2 days has had increased pain and swelling.  Denies any difficulty swallowing, fevers.  Has not been taking any medications for pain nor antibiotics.  HPI     Past Medical History:  Diagnosis Date  . Seizures (HCC)     There are no problems to display for this patient.   Past Surgical History:  Procedure Laterality Date  . APPENDECTOMY         No family history on file.  Social History   Tobacco Use  . Smoking status: Current Every Day Smoker    Packs/day: 1.00    Types: Cigarettes  . Smokeless tobacco: Never Used  Substance Use Topics  . Alcohol use: Yes    Alcohol/week: 24.0 standard drinks    Types: 24 Cans of beer per week  . Drug use: Yes    Types: Methamphetamines, IV    Comment: last used in July     Home Medications Prior to Admission medications   Medication Sig Start Date End Date Taking? Authorizing Provider  amoxicillin-clavulanate (AUGMENTIN) 875-125 MG tablet Take 1 tablet by mouth every 12 (twelve) hours for 7 days. 01/26/21 02/02/21 Yes Evon Slack, PA-C  ibuprofen (ADVIL) 600 MG tablet Take 1 tablet (600 mg total) by mouth every 6 (six) hours as needed for moderate pain. 01/26/21  Yes Evon Slack, PA-C  traMADol (ULTRAM) 50 MG tablet Take 1 tablet (50 mg total) by mouth every 6 (six) hours as needed. 01/26/21 01/26/22 Yes Evon Slack, PA-C  neomycin-polymyxin-pramoxine (NEOSPORIN PLUS) 1 % cream Apply topically 2 (two) times daily. 03/29/20   Joni Reining, PA-C    Allergies    Patient has no  known allergies.  Review of Systems   Review of Systems  Constitutional: Negative.  Negative for chills and fever.  HENT: Positive for dental problem and facial swelling. Negative for drooling, mouth sores, trouble swallowing and voice change.   Respiratory: Negative for shortness of breath.   Cardiovascular: Negative for chest pain.  Gastrointestinal: Negative for nausea and vomiting.  Musculoskeletal: Negative for arthralgias, neck pain and neck stiffness.  Skin: Negative.   Psychiatric/Behavioral: Negative for confusion.  All other systems reviewed and are negative.   Physical Exam Updated Vital Signs BP (!) 130/92 (BP Location: Left Arm)   Pulse 86   Temp 98 F (36.7 C) (Oral)   Resp 16   Ht 5\' 9"  (1.753 m)   Wt 68 kg   SpO2 98%   BMI 22.15 kg/m   Physical Exam Constitutional:      General: He is not in acute distress.    Appearance: Normal appearance. He is well-developed.  HENT:     Head: Normocephalic and atraumatic.     Jaw: No trismus.     Comments: Minimal left sided facial swelling    Right Ear: External ear normal.     Left Ear: External ear normal.     Nose: Nose normal.     Mouth/Throat:     Mouth: No  oral lesions.     Dentition: Normal dentition.     Pharynx: Uvula midline. No oropharyngeal exudate, posterior oropharyngeal erythema or uvula swelling.     Comments: Left lower teeth #17 and 18 with diffuse decay, fractured with surrounding gum swelling but no fluctuance or drainage. Eyes:     Conjunctiva/sclera: Conjunctivae normal.  Cardiovascular:     Rate and Rhythm: Normal rate.     Heart sounds: No murmur heard. No friction rub. No gallop.   Pulmonary:     Effort: Pulmonary effort is normal. No respiratory distress.     Breath sounds: Normal breath sounds.  Musculoskeletal:        General: Normal range of motion.     Cervical back: Normal range of motion and neck supple.  Skin:    General: Skin is warm and dry.     Findings: No rash.   Neurological:     Mental Status: He is alert and oriented to person, place, and time.  Psychiatric:        Behavior: Behavior normal.        Thought Content: Thought content normal.     ED Results / Procedures / Treatments   Labs (all labs ordered are listed, but only abnormal results are displayed) Labs Reviewed - No data to display  EKG None  Radiology No results found.  Procedures Procedures   Medications Ordered in ED Medications - No data to display  ED Course  I have reviewed the triage vital signs and the nursing notes.  Pertinent labs & imaging results that were available during my care of the patient were reviewed by me and considered in my medical decision making (see chart for details).    MDM Rules/Calculators/A&P                          30 year old male with left-sided lower dental decay, has signs and symptoms of abscess.  Vital signs are stable.  Tolerating p.o. well.  We will try oral antibiotics, Augmentin with tramadol and ibuprofen.  He understands importance of following up with dental clinic. Final Clinical Impression(s) / ED Diagnoses Final diagnoses:  Pain, dental  Dental abscess    Rx / DC Orders ED Discharge Orders         Ordered    amoxicillin-clavulanate (AUGMENTIN) 875-125 MG tablet  Every 12 hours        01/26/21 1636    traMADol (ULTRAM) 50 MG tablet  Every 6 hours PRN        01/26/21 1636    ibuprofen (ADVIL) 600 MG tablet  Every 6 hours PRN        01/26/21 1636           Evon Slack, PA-C 01/26/21 1642    Dionne Bucy, MD 01/26/21 1856

## 2021-01-26 NOTE — Discharge Instructions (Addendum)
Please take medications as prescribed.  Return to the ER for any fevers worsening symptoms or urgent changes in your health.  Please call dental clinic tomorrow to schedule follow-up appointment.

## 2021-01-26 NOTE — ED Notes (Signed)
See triage note  Presents with swelling to left lower jaw line for the past 2 days

## 2021-03-19 ENCOUNTER — Encounter: Payer: Self-pay | Admitting: Emergency Medicine

## 2021-03-19 ENCOUNTER — Emergency Department
Admission: EM | Admit: 2021-03-19 | Discharge: 2021-03-19 | Disposition: A | Discharge status: Home / Self Care | Payer: Self-pay | Attending: Physician Assistant | Admitting: Physician Assistant

## 2021-03-19 ENCOUNTER — Other Ambulatory Visit: Payer: Self-pay

## 2021-03-19 DIAGNOSIS — J029 Acute pharyngitis, unspecified: Secondary | ICD-10-CM | POA: Insufficient documentation

## 2021-03-19 DIAGNOSIS — Z5321 Procedure and treatment not carried out due to patient leaving prior to being seen by health care provider: Secondary | ICD-10-CM | POA: Insufficient documentation

## 2021-03-19 NOTE — ED Triage Notes (Signed)
Says he has sore thorat and head congestion for few days.  Then cough.  And today he coughed up some blood.  No fever,.  He did a covid test and it was neg

## 2021-07-23 IMAGING — CR DG SHOULDER 2+V*L*
3 series · 3 of 3 positions shown · non-contrast
Comparison: None.

CLINICAL DATA: Seizure with left shoulder pain. Decreased range of
motion.

EXAM:
LEFT SHOULDER - 2+ VIEW

[shoulder grashey]
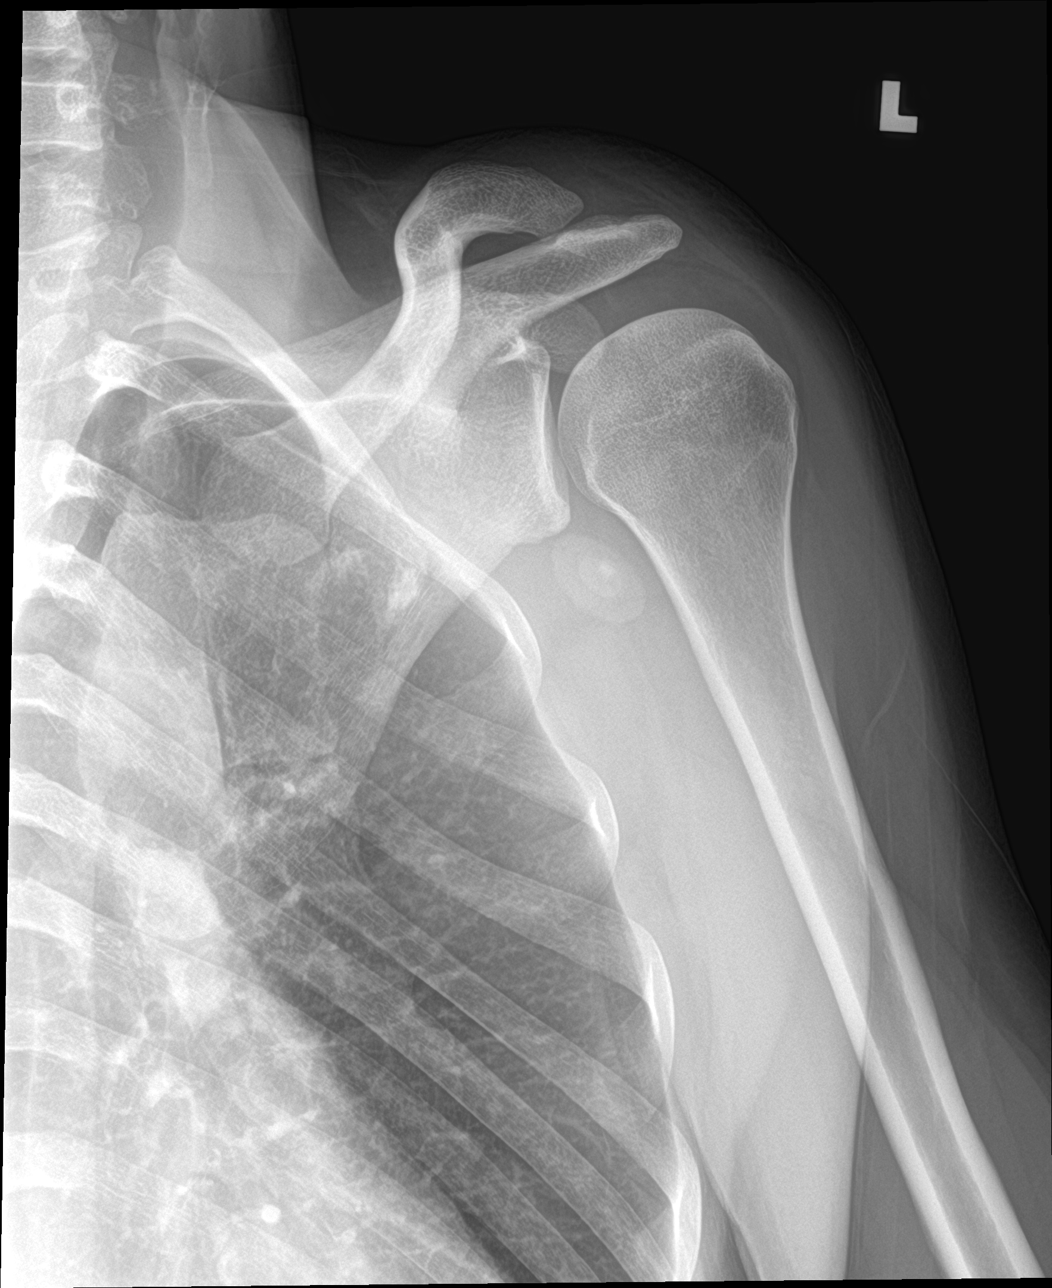

[shoulder y view]
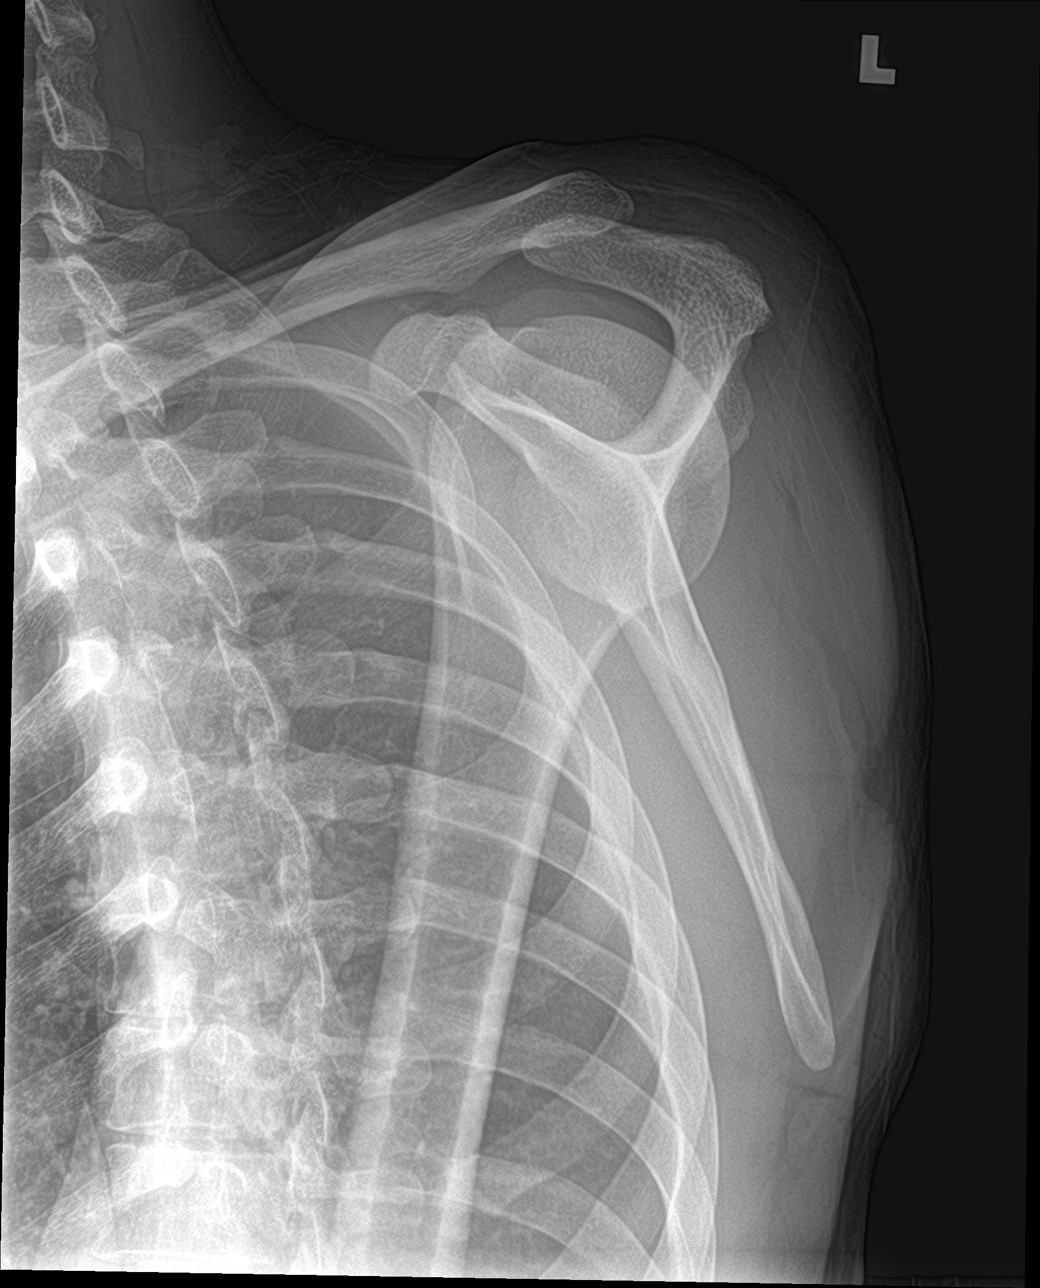

[shoulder axillary]
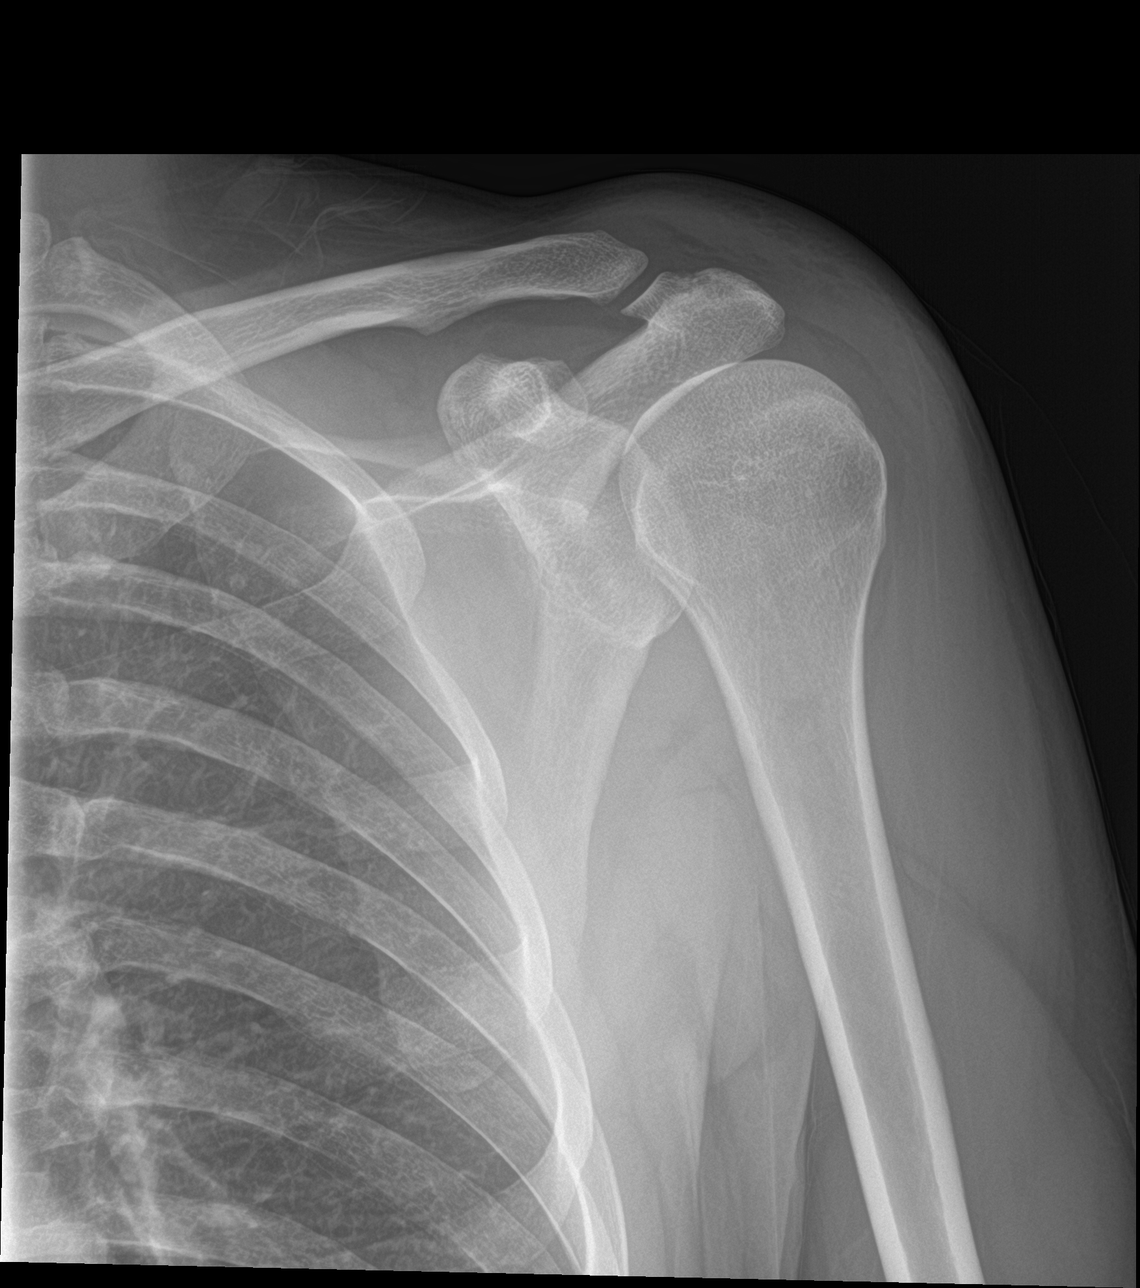

[3 of 3 positions shown; findings below may reference images not displayed]

FINDINGS: Visualized portion of the left hemithorax is normal. No acute
fracture or dislocation. Mild anterior position of the humerus
relative to the central glenoid on the scapular view is favored to
be positional.
IMPRESSION: No acute osseous abnormality.

## 2021-07-29 ENCOUNTER — Emergency Department
Admission: EM | Admit: 2021-07-29 | Discharge: 2021-07-29 | Disposition: A | Discharge status: Home / Self Care | Payer: Self-pay | Attending: Emergency Medicine | Admitting: Emergency Medicine

## 2021-07-29 ENCOUNTER — Other Ambulatory Visit: Payer: Self-pay

## 2021-07-29 DIAGNOSIS — K0889 Other specified disorders of teeth and supporting structures: Secondary | ICD-10-CM | POA: Insufficient documentation

## 2021-07-29 DIAGNOSIS — Z5321 Procedure and treatment not carried out due to patient leaving prior to being seen by health care provider: Secondary | ICD-10-CM | POA: Insufficient documentation

## 2021-07-29 NOTE — ED Triage Notes (Signed)
Pt comes with c/o left sided dental pain for few days.

## 2021-08-10 IMAGING — DX DG SHOULDER 2+V*L*
3 series · 3 of 3 positions shown · non-contrast
Comparison: None.

CLINICAL DATA: Tenderness and swelling at AC joint

EXAM:
LEFT SHOULDER - 2+ VIEW

[shoulder axial]
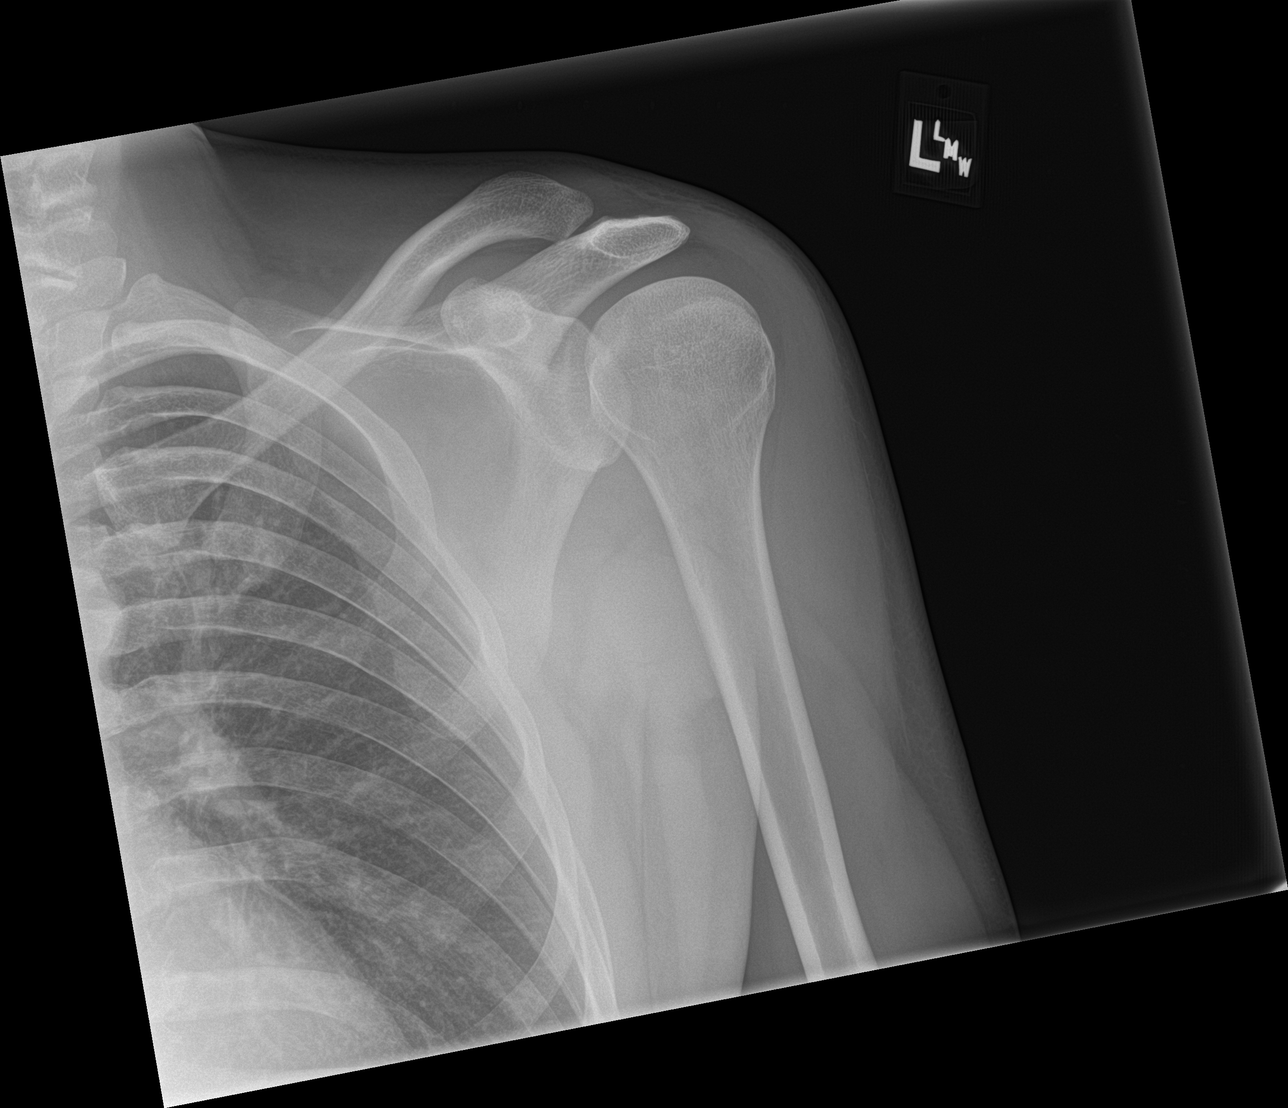

[shoulder ap]
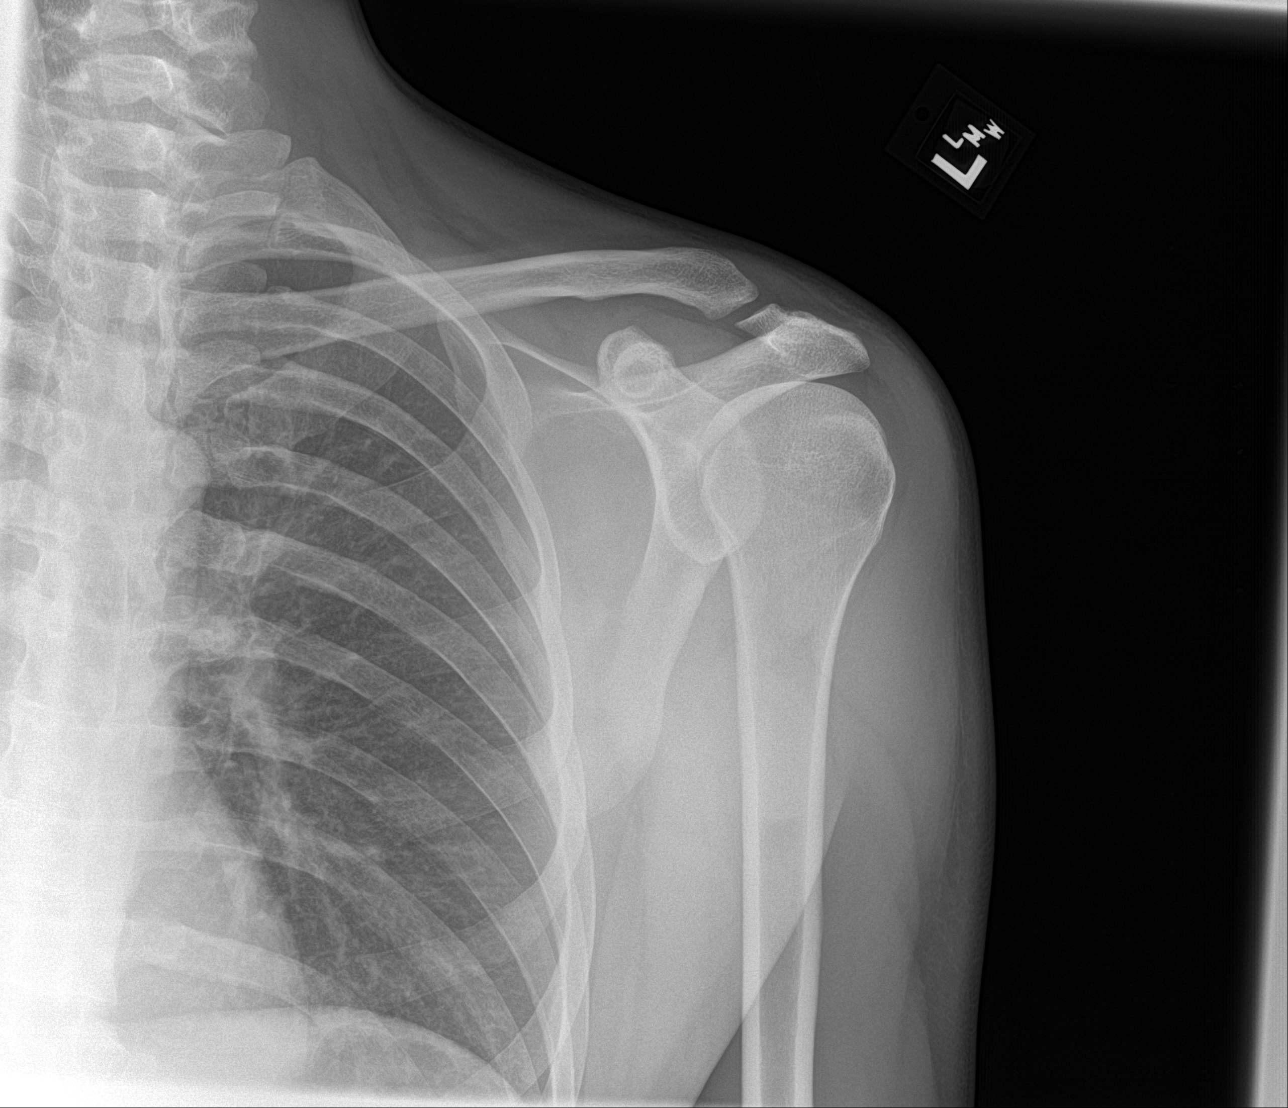

[shoulder obl]
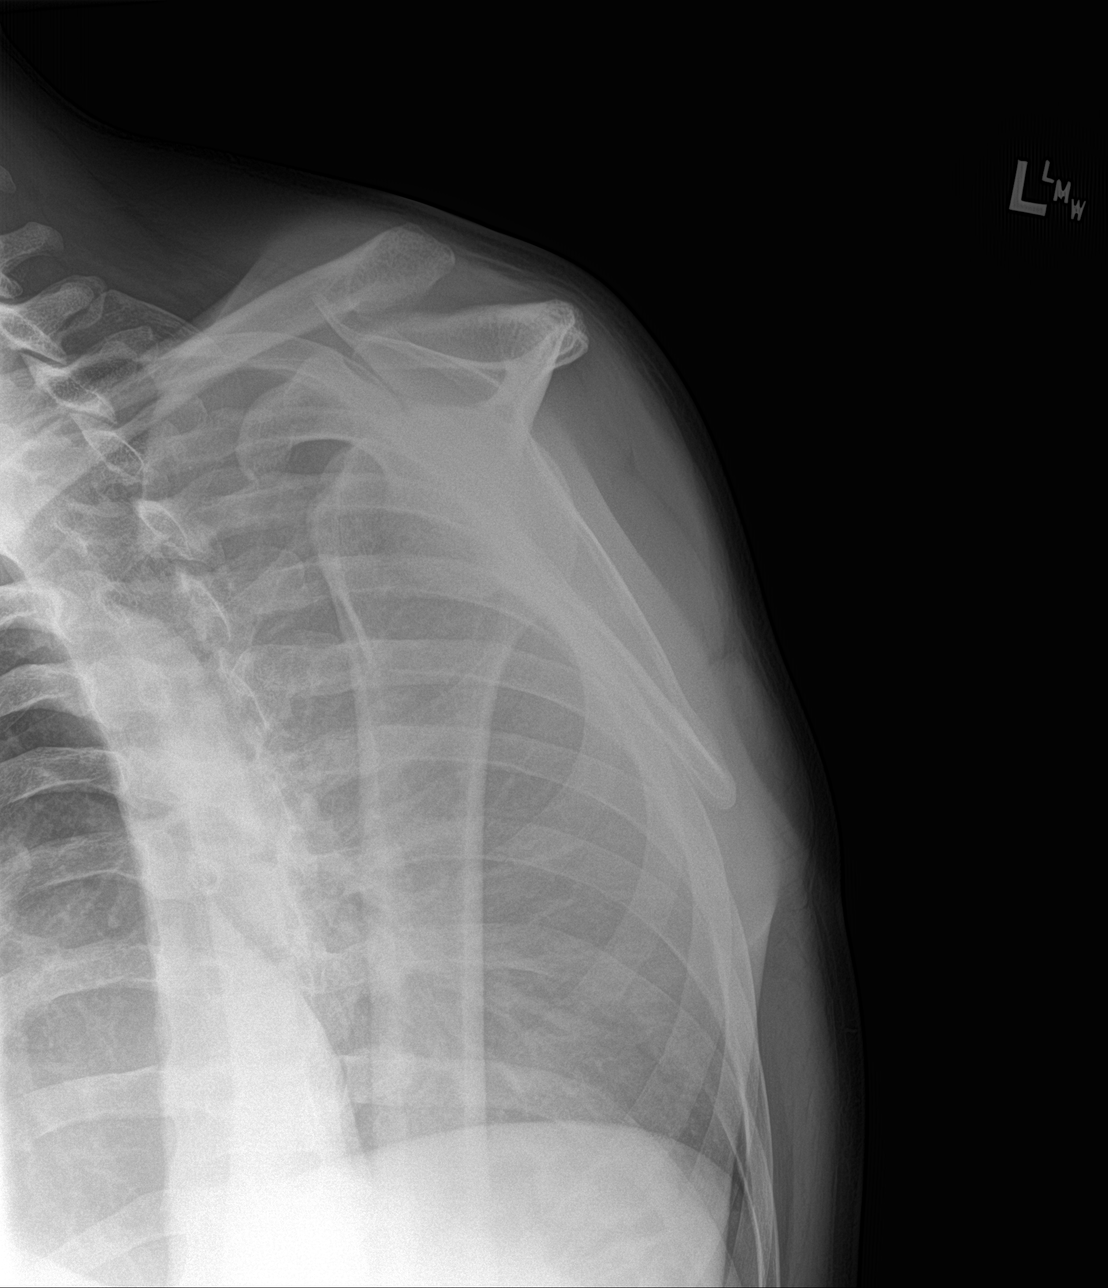

[3 of 3 positions shown; findings below may reference images not displayed]

FINDINGS: There is no evidence of fracture or dislocation. There is no
evidence of arthropathy or other focal bone abnormality. There is
soft tissue swelling seen at the superior portion of the AC joint
which is slightly incongruous. No AC joint widening however is
noted.
IMPRESSION: Findings which may be suggestive of grade 1 AC joint injury with
mild soft tissue swelling.

## 2021-09-30 ENCOUNTER — Other Ambulatory Visit: Payer: Self-pay

## 2021-09-30 ENCOUNTER — Emergency Department
Admission: EM | Admit: 2021-09-30 | Discharge: 2021-09-30 | Disposition: A | Discharge status: Home / Self Care | Payer: Self-pay | Attending: Emergency Medicine | Admitting: Emergency Medicine

## 2021-09-30 ENCOUNTER — Encounter: Payer: Self-pay | Admitting: Emergency Medicine

## 2021-09-30 DIAGNOSIS — R112 Nausea with vomiting, unspecified: Secondary | ICD-10-CM | POA: Insufficient documentation

## 2021-09-30 DIAGNOSIS — U071 COVID-19: Secondary | ICD-10-CM | POA: Insufficient documentation

## 2021-09-30 LAB — RESP PANEL BY RT-PCR (FLU A&B, COVID) ARPGX2
Influenza A by PCR: NEGATIVE
Influenza B by PCR: NEGATIVE
SARS Coronavirus 2 by RT PCR: POSITIVE — AB

## 2021-09-30 MED ORDER — FLUTICASONE PROPIONATE 50 MCG/ACT NA SUSP: 1.0000 | Freq: Two times a day (BID) | NASAL | 0 refills | Status: AC

## 2021-09-30 MED ORDER — ONDANSETRON 4 MG PO TBDP: 4.0000 mg | ORAL_TABLET | Freq: Three times a day (TID) | ORAL | 0 refills | Status: AC | PRN

## 2021-09-30 MED ORDER — PSEUDOEPH-BROMPHEN-DM 30-2-10 MG/5ML PO SYRP: 10.0000 mL | ORAL_SOLUTION | Freq: Four times a day (QID) | ORAL | 0 refills | Status: DC | PRN

## 2021-09-30 NOTE — ED Triage Notes (Signed)
Pt reports Sunday felt nauseated and threw up a couple of times. And then Monday started with a cough. Pt reports took a home covid test and it was positive but his employer told him he needed to come to the ED and get proof of it being positive.

## 2021-09-30 NOTE — ED Provider Notes (Signed)
[facility] Provider Note  Patient Contact: 8:13 PM (approximate)   History   Cough and Nausea   HPI  Noah Patel is a 31 y.o. male who presents to the emergency department complaining of possible COVID infection.  Patient states that he has had symptoms of same, took a home test which was positive.  He states that he is here primarily as his work requires a Solicitor from a doctor in a doctor's note."  Patient states that he has had congestion, cough, headache, body aches, nausea and emesis.  Patient states that overall he seems to be feeling better than yesterday but did start coughing more than yesterday.  He is here for a positive COVID test and work note only.     Physical Exam   Triage Vital Signs: ED Triage Vitals  Enc Vitals Group     BP 09/30/21 1820 138/79     Pulse Rate 09/30/21 1820 79     Resp 09/30/21 1820 20     Temp 09/30/21 1818 98.3 F (36.8 C)     Temp Source 09/30/21 1818 Oral     SpO2 09/30/21 1820 96 %     Weight 09/30/21 1813 149 lb 14.6 oz (68 kg)     Height 09/30/21 1813 5\' 9"  (1.753 m)     Head Circumference --      Peak Flow --      Pain Score 09/30/21 1813 0     Pain Loc --      Pain Edu? --      Excl. in GC? --     Most recent vital signs: Vitals:   09/30/21 1818 09/30/21 1820  BP:  138/79  Pulse:  79  Resp:  20  Temp: 98.3 F (36.8 C)   SpO2:  96%     General: Alert and in no acute distress. ENT:      Ears: EACs and TMs unremarkable bilaterally.      Nose: Moderate to significant congestion/rhinnorhea.      Mouth/Throat: Mucous membranes are moist. Neck: No stridor. No cervical spine tenderness to palpation. Hematological/Lymphatic/Immunilogical: No cervical lymphadenopathy. Cardiovascular:  Good peripheral perfusion Respiratory: Normal respiratory effort without tachypnea or retractions. Lungs CTAB. Good air entry to the bases with no decreased or absent breath sounds. Gastrointestinal: Bowel sounds 4  quadrants. Soft and nontender to palpation. No guarding or rigidity. No palpable masses. No distention.  Musculoskeletal: Full range of motion to all extremities.  Neurologic:  No gross focal neurologic deficits are appreciated.  Skin:   No rash noted Other:   ED Results / Procedures / Treatments   Labs (all labs ordered are listed, but only abnormal results are displayed) Labs Reviewed  RESP PANEL BY RT-PCR (FLU A&B, COVID) ARPGX2 - Abnormal; Notable for the following components:      Result Value   SARS Coronavirus 2 by RT PCR POSITIVE (*)    All other components within normal limits     EKG     RADIOLOGY    No results found.  PROCEDURES:  Critical Care performed: No  Procedures   MEDICATIONS ORDERED IN ED: Medications - No data to display   IMPRESSION / MDM / ASSESSMENT AND PLAN / ED COURSE  I reviewed the triage vital signs and the nursing notes.                              Differential  diagnosis includes, but is not limited to, COVID-19, influenza, viral illness   Patient's diagnosis is consistent with COVID-19.  Patient presented to the emergency department after taking a test at home which was positive for COVID.  Patient states that his work would not accept a home test and he was required to have a test provided by a doctor as well as a doctor's note.  Patient did test positive for COVID-19 here which is consistent with the symptoms.  Symptom control medications will be prescribed for the patient at this time.  No indication for further work-up.  Patient is stable for discharge at this time. Patient is given ED precautions to return to the ED for any worsening or new symptoms.        FINAL CLINICAL IMPRESSION(S) / ED DIAGNOSES   Final diagnoses:  COVID-19     Rx / DC Orders   ED Discharge Orders          Ordered    ondansetron (ZOFRAN-ODT) 4 MG disintegrating tablet  Every 8 hours PRN        09/30/21 2013     brompheniramine-pseudoephedrine-DM 30-2-10 MG/5ML syrup  4 times daily PRN        09/30/21 2013    fluticasone (FLONASE) 50 MCG/ACT nasal spray  2 times daily        09/30/21 2013             Note:  This document was prepared using Dragon voice recognition software and may include unintentional dictation errors.   Lanette Hampshire 09/30/21 2304    Shaune Pollack, MD 10/07/21 [phone]

## 2021-09-30 NOTE — ED Triage Notes (Signed)
Tested positive for COVID today.  C/O cough and congestion.  Here for Test for work and discharge instructions.

## 2021-12-03 ENCOUNTER — Emergency Department
Admission: EM | Admit: 2021-12-03 | Discharge: 2021-12-04 | Disposition: A | Discharge status: Home / Self Care | Payer: Self-pay | Attending: Emergency Medicine | Admitting: Emergency Medicine

## 2021-12-03 ENCOUNTER — Other Ambulatory Visit: Payer: Self-pay

## 2021-12-03 DIAGNOSIS — T402X1A Poisoning by other opioids, accidental (unintentional), initial encounter: Secondary | ICD-10-CM | POA: Insufficient documentation

## 2021-12-03 DIAGNOSIS — R000 Tachycardia, unspecified: Secondary | ICD-10-CM | POA: Insufficient documentation

## 2021-12-03 MED ORDER — NALOXONE HCL 4 MG/0.1ML NA LIQD: NASAL | 0 refills | Status: AC

## 2021-12-03 NOTE — ED Provider Notes (Signed)
? ?[facility] ?Provider Note ? ? ? Event Date/Time  ? First MD Initiated Contact with Patient 12/03/21 2124   ?  (approximate) ? ? ?History  ? ?Drug Overdose ? ? ?HPI ? ?Noah Patel is a 31 y.o. male with a remote history of seizure disorder who presents via EMS with concern for opioid overdose after the called after patient was found unresponsive.  Patient was found in a car unresponsive and given 2 mg of intranasal Narcan with rapid improvement in his mental status.  Patient endorses snorting crushed up Percocet that he had earlier today time to get high.  He does not use every day and was not tried to kill himself.  States he does not remember what happens after he started this.  He denies any prior similar overdose.  He does not take any daily medications and denies any EtOH use benzos or any other with drug use.  Denies any other sick symptoms including fevers, chills, cough, nausea, vomiting, diarrhea, dysuria, rash or any other sick symptoms.  States he feels embarrassed about what happened earlier today. ? ?  ? ? ?Physical Exam  ?Triage Vital Signs: ?ED Triage Vitals  ?Enc Vitals Group  ?   BP 12/03/21 2119 132/82  ?   Pulse Rate 12/03/21 2119 (!) 105  ?   Resp 12/03/21 2119 18  ?   Temp 12/03/21 2119 97.7 ?F (36.5 ?C)  ?   Temp Source 12/03/21 2119 Oral  ?   SpO2 12/03/21 2119 96 %  ?   Weight 12/03/21 2121 174 lb (78.9 kg)  ?   Height 12/03/21 2121 5\' 10"  (1.778 m)  ?   Head Circumference --   ?   Peak Flow --   ?   Pain Score 12/03/21 2121 6  ?   Pain Loc --   ?   Pain Edu? --   ?   Excl. in GC? --   ? ? ?Most recent vital signs: ?Vitals:  ? 12/03/21 2200 12/03/21 2230  ?BP: 122/78 120/68  ?Pulse: 95 80  ?Resp: 17 14  ?Temp:    ?SpO2: 93% 94%  ? ? ?General: Awake, no distress.  ?CV:  Good peripheral perfusion.  ?Resp:  Normal effort.  ?Abd:  No distention.  ?Other:   ? ? ?ED Results / Procedures / Treatments  ?Labs ?(all labs ordered are listed, but only abnormal results are  displayed) ?Labs Reviewed - No data to display ? ? ?EKG ? ?ECG is markable sinus tachycardia with a ventricular rate of 106, normal axis, unremarkable intervals without evidence of acute ischemia or significant arrhythmia. ? ? ?RADIOLOGY ? ? ? ? ?PROCEDURES: ? ?Critical Care performed: No ? ?.1-3 Lead EKG Interpretation ?Performed by: 12/05/21, MD ?Authorized by: Gilles Chiquito, MD  ? ?  Interpretation: normal   ?  ECG rate assessment: normal   ?  Rhythm: sinus rhythm   ?  Ectopy: none   ?  Conduction: normal   ? ?The patient is on the cardiac monitor to evaluate for evidence of arrhythmia and/or significant heart rate changes. ? ? ?MEDICATIONS ORDERED IN ED: ?Medications - No data to display ? ? ?IMPRESSION / MDM / ASSESSMENT AND PLAN / ED COURSE  ?I reviewed the triage vital signs and the nursing notes. ?             ?               ? ?  Patient presents with above-stated history and exam for evaluation after EMS was called for unresponsiveness with improvement of mental status after he received intranasal Narcan.  History exam is most visit with acute accidental opiate overdose.  We will plan to observe for 3 hours.  History exam is not suggestive of a suicide attempt, significant traumatic injury or acute infectious process or metabolic derangement.  Patient is awake and alert and appropriate denies any other acute concerns on arrival to the emergency room. ?  ?Care patient signed over to assuming father approximately 2300.  Plan is to observe for 3 hours and if patient does not require any Narcan and maintained stable vitals and mentation he can likely safely discharged with outpatient follow-up.  I will write Rx for Narcan as well. ? ?FINAL CLINICAL IMPRESSION(S) / ED DIAGNOSES  ? ?Final diagnoses:  ?Opioid overdose, accidental or unintentional, initial encounter (HCC)  ? ? ? ?Rx / DC Orders  ? ?ED Discharge Orders   ? ?      Ordered  ?  naloxone (NARCAN) nasal spray 4 mg/0.1 mL       ? 12/03/21 2311   ? ?  ?  ? ?  ? ? ? ?Note:  This document was prepared using Dragon voice recognition software and may include unintentional dictation errors. ?  ?Gilles Chiquito, MD ?12/03/21 2311 ? ?

## 2021-12-03 NOTE — ED Triage Notes (Signed)
Pt was found in car with friend unresponsive and breathing 1/min, pt was given 2mg  narcan intranasally and  had positive response, pt admits to snorting 1 30mg  percocet. Pt a&o x4 on arrival to ed ?

## 2021-12-03 NOTE — ED Notes (Signed)
Pt given sprite 

## 2022-02-07 ENCOUNTER — Other Ambulatory Visit: Payer: Self-pay

## 2022-02-07 ENCOUNTER — Emergency Department
Admission: EM | Admit: 2022-02-07 | Discharge: 2022-02-07 | Discharge status: Against Medical Advice | Payer: Self-pay | Attending: Emergency Medicine | Admitting: Emergency Medicine

## 2022-02-07 DIAGNOSIS — X500XXA Overexertion from strenuous movement or load, initial encounter: Secondary | ICD-10-CM | POA: Insufficient documentation

## 2022-02-07 DIAGNOSIS — R0602 Shortness of breath: Secondary | ICD-10-CM | POA: Insufficient documentation

## 2022-02-07 DIAGNOSIS — M545 Low back pain, unspecified: Secondary | ICD-10-CM | POA: Insufficient documentation

## 2022-02-07 DIAGNOSIS — Z5321 Procedure and treatment not carried out due to patient leaving prior to being seen by health care provider: Secondary | ICD-10-CM | POA: Insufficient documentation

## 2022-02-07 NOTE — ED Provider Triage Note (Signed)
Emergency Medicine Provider Triage Evaluation Note  Noah Patel , a 31 y.o. male  was evaluated in triage.  Pt complains of pain in low back that began today, but he has had it before intermittently for the past 2 weeks. Carries heavy things at work. No radiation of pain into legs or abdomen. Reports improvement with sitting. No CP/SOB. No urinary/fecal incontinence or retention. No saddle anesthesia. No injury. No fever. No overt injury. Has not taken anything for pain.  There are no problems to display for this patient.    Review of Systems  Positive: Back pain Negative: Radicular symptoms, CP/SOB, N/V/D  Physical Exam  There were no vitals taken for this visit. Gen:   Awake, no distress   Resp:  Normal effort  MSK:   Moves extremities without difficulty  Other:    Medical Decision Making  Medically screening exam initiated at 2:50 PM.  Appropriate orders placed.  Noah Patel was informed that the remainder of the evaluation will be completed by another provider, this initial triage assessment does not replace that evaluation, and the importance of remaining in the ED until their evaluation is complete.     Jackelyn Hoehn, PA-C 02/07/22 1453

## 2022-02-07 NOTE — ED Notes (Signed)
Called to room without answer.  

## 2022-02-07 NOTE — ED Triage Notes (Signed)
Pt states he feels pain in his mid back and thinks it is his lungs- pt states he does have some slight SHOB- pt states pain has been off and on for a couple weeks- pt does do heavy lifting at work

## 2022-02-14 ENCOUNTER — Other Ambulatory Visit: Payer: Self-pay

## 2022-02-14 ENCOUNTER — Emergency Department
Admission: EM | Admit: 2022-02-14 | Discharge: 2022-02-14 | Disposition: A | Discharge status: Home / Self Care | Payer: Self-pay | Attending: Student in an Organized Health Care Education/Training Program | Admitting: Student in an Organized Health Care Education/Training Program

## 2022-02-14 DIAGNOSIS — K0889 Other specified disorders of teeth and supporting structures: Secondary | ICD-10-CM | POA: Insufficient documentation

## 2022-02-14 MED ORDER — KETOROLAC TROMETHAMINE 30 MG/ML IJ SOLN
30.0000 mg | Freq: Once | INTRAMUSCULAR | Status: AC
  Administered 2022-02-14: 30 mg via INTRAMUSCULAR
  Filled 2022-02-14: qty 1

## 2022-02-14 MED ORDER — AMOXICILLIN-POT CLAVULANATE 875-125 MG PO TABS
1.0000 | ORAL_TABLET | Freq: Once | ORAL | Status: AC
  Administered 2022-02-14: 1 via ORAL
  Filled 2022-02-14: qty 1

## 2022-02-14 MED ORDER — AMOXICILLIN-POT CLAVULANATE 875-125 MG PO TABS: 1.0000 | ORAL_TABLET | Freq: Two times a day (BID) | ORAL | 0 refills | Status: AC

## 2022-02-14 NOTE — ED Triage Notes (Signed)
Pt with right sided jaw pain. Pt states has several sore teeth on that side. Pt appears in no acute distress, no swelling to face noted.

## 2022-02-14 NOTE — Discharge Instructions (Addendum)
Take Augmentin twice daily for the next 10 days. Please schedule an appointment with a local dentist. Take naproxen twice daily and 650 mg of Tylenol every 6 hours.  OPTIONS FOR DENTAL FOLLOW UP CARE  [place] [facility] and [facility] - Local Safety Net Dental Clinics TripDoors.com.htm   [facility] [phone])  Noah Patel [phone])  [place] [phone] ext [phone])  [facility] [phone])  [facility] [phone]) This clinic caters to the indigent population and is on a lottery system. Location: [employer] [facility], [employer], [address] [place] Clinic Hours: Wednesdays from 6pm - 9pm, patients seen by a lottery system. For dates, call or go to [email or web address] Services: Cleanings, fillings and simple extractions. Payment Options: DENTAL WORK IS FREE OF CHARGE. Bring proof of income or support. Best way to get seen: Arrive at 5:15 pm - this is a lottery, NOT first come/first serve, so arriving earlier will not increase your chances of being seen.     [facility] [phone] Select option 1 for emergencies   Location: [facility], [place], [address] [place] Clinic Hours: No walk-ins accepted - call the day before to schedule an appointment. Check in times are 9:30 am and 1:30 pm. Services: Simple extractions, temporary fillings, pulpectomy/pulp debridement, uncomplicated abscess drainage. Payment Options: PAYMENT IS DUE AT THE TIME OF SERVICE.  Fee is usually $100-200, additional surgical procedures (e.g. abscess drainage) may be extra. Cash, checks, Visa/MasterCard accepted.  Can file Medicaid if patient is covered for dental - patient should call case worker to check. No discount for [facility] patients. Best way to get seen: MUST  call the day before and get onto the schedule. Can usually be seen the next 1-2 days. No walk-ins accepted.     [facility] [phone]   Location: [facility], [address] [place] Clinic Hours: M, W, Th, F 8am or 1:30pm, Tues 9a or 1:30 - first come/first served. Services: Simple extractions, temporary fillings, uncomplicated abscess drainage.  You do not need to be an [facility] resident. Payment Options: PAYMENT IS DUE AT THE TIME OF SERVICE. Dental insurance, otherwise sliding scale - bring proof of income or support. Depending on income and treatment needed, cost is usually $50-200. Best way to get seen: Arrive early as it is first come/first served.     [facility] [phone]   Location: [address] Clinic Hours: Mon-Thu 8a-5p Services: Most basic dental services including extractions and fillings. Payment Options: PAYMENT IS DUE AT THE TIME OF SERVICE. Sliding scale, up to 50% off - bring proof if income or support. Medicaid with dental option accepted. Best way to get seen: Call to schedule an appointment, can usually be seen within 2 weeks OR they will try to see walk-ins - show up at 8a or 2p (you may have to wait).     [facility] [phone] [place] RESIDENTS ONLY   Location: [facility], [address], [place], [place] [address] Clinic Hours: By appointment only. Monday - Thursday 8am-5pm, Friday 8am-12pm Services: Cleanings, fillings, extractions. Payment Options: PAYMENT IS DUE AT THE TIME OF SERVICE. Cash, Visa or MasterCard. Sliding scale - $30 minimum per service. Best way to get seen: Come in to office, complete packet and make an appointment - need proof of income or support monies for each household member  and proof of [facility] residence. Usually takes about a month to get in.     [facility]  Dental Clinic [phone]   Location: [address], [facility] Hours: Walk-in Urgent Care Dental Services are offered Monday-Friday mornings only. The numbers of emergencies accepted daily is limited to the number of providers available. Maximum 15 - Mondays, Wednesdays & Thursdays Maximum 10 - Tuesdays & Fridays Services: You do not need to be a [facility] resident to be seen for a dental emergency. Emergencies are defined as pain, swelling, abnormal bleeding, or dental trauma. Walkins will receive x-rays if needed. NOTE: Dental cleaning is not an emergency. Payment Options: PAYMENT IS DUE AT THE TIME OF SERVICE. Minimum co-pay is $40.00 for uninsured patients. Minimum co-pay is $3.00 for Medicaid with dental coverage. Dental Insurance is accepted and must be presented at time of visit. Medicare does not cover dental. Forms of payment: Cash, credit card, checks. Best way to get seen: If not previously registered with the clinic, walk-in dental registration begins at 7:15 am and is on a first come/first serve basis. If previously registered with the clinic, call to make an appointment.     [facility] [phone] [place] RESIDENTS ONLY   Location: [address], [place], [place] Clinic Hours: Mon-Thu 10a-2p Services: Extractions only! Payment Options: FREE (donations accepted) - bring proof of income or support Best way to get seen: Call and schedule an appointment OR come at 8am on the 1st Monday of every month (except for holidays) when it is first come/first served.     [facility] [phone]   Location: [address] [place], [place] Clinic Hours: Friday mornings Services, Payment Options, Best way to get seen: Call for info

## 2022-02-14 NOTE — ED Provider Notes (Signed)
[facility] Provider Note  Patient Contact: 9:00 PM (approximate)   History   Dental Pain   HPI  Noah Patel is a 31 y.o. male presents to the emergency department with right lower dental pain.  Patient states that he has several broken teeth and is trying to secure an appointment with a dentist.  He has no pain underneath the tongue or difficulty swallowing.  No fever or chills at home.  No other alleviating measures have been attempted.      Physical Exam   Triage Vital Signs: ED Triage Vitals  Enc Vitals Group     BP 02/14/22 1923 134/88     Pulse Rate 02/14/22 1923 70     Resp 02/14/22 1923 16     Temp 02/14/22 1923 98.1 F (36.7 C)     Temp Source 02/14/22 1923 Oral     SpO2 02/14/22 1923 99 %     Weight 02/14/22 1923 180 lb (81.6 kg)     Height 02/14/22 1923 5\' 9"  (1.753 m)     Head Circumference --      Peak Flow --      Pain Score 02/14/22 1925 10     Pain Loc --      Pain Edu? --      Excl. in GC? --     Most recent vital signs: Vitals:   02/14/22 1923  BP: 134/88  Pulse: 70  Resp: 16  Temp: 98.1 F (36.7 C)  SpO2: 99%     General: Alert and in no acute distress. Eyes:  PERRL. EOMI. Head: No acute traumatic findings ENT:      Nose: No congestion/rhinnorhea.      Mouth/Throat: Mucous membranes are moist.  Patient has broken inferior 31 and 29.  No pain underneath the tongue. Neck: No stridor. No cervical spine tenderness to palpation. Cardiovascular:  Good peripheral perfusion Respiratory: Normal respiratory effort without tachypnea or retractions. Lungs CTAB. Good air entry to the bases with no decreased or absent breath sounds. Gastrointestinal: Bowel sounds 4 quadrants. Soft and nontender to palpation. No guarding or rigidity. No palpable masses. No distention. No CVA tenderness. Musculoskeletal: Full range of motion to all extremities.  Neurologic:  No gross focal neurologic deficits are appreciated.  Skin:   No  rash noted Other:   ED Results / Procedures / Treatments   Labs (all labs ordered are listed, but only abnormal results are displayed) Labs Reviewed - No data to display    PROCEDURES:  Critical Care performed: No  Procedures   MEDICATIONS ORDERED IN ED: Medications  amoxicillin-clavulanate (AUGMENTIN) 875-125 MG per tablet 1 tablet (has no administration in time range)  ketorolac (TORADOL) 30 MG/ML injection 30 mg (has no administration in time range)     IMPRESSION / MDM / ASSESSMENT AND PLAN / ED COURSE  I reviewed the triage vital signs and the nursing notes.                              Assessment and plan Dental pain 31 year old male presents to the emergency department with right lower dental pain.  He has broken inferior 31 and 29.  No drainable fluid collection appreciated on physical exam.  We will treat patient with Augmentin and provide dental resources in patient's discharge paperwork.  He was given an injection of Toradol prior to discharge.  Return precautions were given to return with new  or worsening symptoms.      FINAL CLINICAL IMPRESSION(S) / ED DIAGNOSES   Final diagnoses:  Pain, dental     Rx / DC Orders   ED Discharge Orders          Ordered    amoxicillin-clavulanate (AUGMENTIN) 875-125 MG tablet  2 times daily        02/14/22 2104             Note:  This document was prepared using Dragon voice recognition software and may include unintentional dictation errors.   Pia Mau [place], Cordelia Poche 02/14/22 2104    Phineas Semen, MD 02/14/22 2106

## 2022-04-17 ENCOUNTER — Other Ambulatory Visit: Payer: Self-pay

## 2022-04-17 ENCOUNTER — Encounter: Payer: Self-pay | Admitting: Emergency Medicine

## 2022-04-17 ENCOUNTER — Emergency Department
Admission: EM | Admit: 2022-04-17 | Discharge: 2022-04-17 | Discharge status: Against Medical Advice | Payer: Self-pay | Attending: Emergency Medicine | Admitting: Emergency Medicine

## 2022-04-17 DIAGNOSIS — Z5321 Procedure and treatment not carried out due to patient leaving prior to being seen by health care provider: Secondary | ICD-10-CM | POA: Insufficient documentation

## 2022-04-17 DIAGNOSIS — R112 Nausea with vomiting, unspecified: Secondary | ICD-10-CM | POA: Insufficient documentation

## 2022-04-17 LAB — URINALYSIS, ROUTINE W REFLEX MICROSCOPIC
Bilirubin Urine: NEGATIVE
Glucose, UA: NEGATIVE mg/dL
Ketones, ur: NEGATIVE mg/dL
Leukocytes,Ua: NEGATIVE
Nitrite: NEGATIVE
Protein, ur: 100 mg/dL — AB
Specific Gravity, Urine: 1.029 (ref 1.005–1.030)
pH: 5 (ref 5.0–8.0)

## 2022-04-17 LAB — COMPREHENSIVE METABOLIC PANEL
ALT: 46 U/L — ABNORMAL HIGH (ref 0–44)
AST: 22 U/L (ref 15–41)
Albumin: 4.5 g/dL (ref 3.5–5.0)
Alkaline Phosphatase: 83 U/L (ref 38–126)
Anion gap: 9 (ref 5–15)
BUN: 18 mg/dL (ref 6–20)
CO2: 22 mmol/L (ref 22–32)
Calcium: 9.2 mg/dL (ref 8.9–10.3)
Chloride: 108 mmol/L (ref 98–111)
Creatinine, Ser: 0.74 mg/dL (ref 0.61–1.24)
GFR, Estimated: >60 mL/min (ref >60)
Glucose, Bld: 125 mg/dL — ABNORMAL HIGH (ref 70–99)
Potassium: 3.6 mmol/L (ref 3.5–5.1)
Sodium: 139 mmol/L (ref 135–145)
Total Bilirubin: 1.1 mg/dL (ref 0.3–1.2)
Total Protein: 7.7 g/dL (ref 6.5–8.1)

## 2022-04-17 LAB — LIPASE, BLOOD: Lipase: 38 U/L (ref 11–51)

## 2022-04-17 LAB — CBC
HCT: 47.6 % (ref 39.0–52.0)
Hemoglobin: 16.2 g/dL (ref 13.0–17.0)
MCH: 29.7 pg (ref 26.0–34.0)
MCHC: 34 g/dL (ref 30.0–36.0)
MCV: 87.3 fL (ref 80.0–100.0)
Platelets: 240 10*3/uL (ref 150–400)
RBC: 5.45 MIL/uL (ref 4.22–5.81)
RDW: 13.3 % (ref 11.5–15.5)
WBC: 13.6 10*3/uL — ABNORMAL HIGH (ref 4.0–10.5)
nRBC: 0 % (ref 0.0–0.2)

## 2022-04-17 MED ORDER — ONDANSETRON 4 MG PO TBDP: 4.0000 mg | ORAL_TABLET | Freq: Once | ORAL | Status: AC

## 2022-04-17 MED ORDER — ONDANSETRON 4 MG PO TBDP
ORAL_TABLET | ORAL | Status: AC
  Administered 2022-04-17: 4 mg via ORAL
  Filled 2022-04-17: qty 1

## 2022-04-17 NOTE — ED Provider Triage Note (Signed)
Emergency Medicine Provider Triage Evaluation Note  Noah Patel, a 31 y.o. male  was evaluated in triage.  Pt complains of nausea and vomiting with onset today.  Patient reports episodes of persistent nausea and vomiting with his last solid food intake yesterday.  Denies any significant belly pain, urinary symptoms, fever, chills, sweats.  He denies any known sick contacts or bad food exposure.  He reports a history of appendectomy.  Review of Systems  Positive: NV Negative: CP, ABD pain  Physical Exam  BP (!) 138/98   Pulse 63   Temp 98.4 F (36.9 C) (Oral)   Resp 18   Ht 5\' 9"  (1.753 m)   Wt 77.1 kg   SpO2 100%   BMI 25.10 kg/m  Gen:   Awake, no distress  NAD Resp:  Normal effort CTA MSK:   Moves extremities without difficulty  ABD:  Soft, nontender  Medical Decision Making  Medically screening exam initiated at 6:17 PM.  Appropriate orders placed.  Noah Patel was informed that the remainder of the evaluation will be completed by another provider, this initial triage assessment does not replace that evaluation, and the importance of remaining in the ED until their evaluation is complete.  Patient to the ED with persistent intermittent nausea and vomiting for the last day.   Benedetto Goad, PA-C 04/17/22 04/19/22

## 2022-04-17 NOTE — ED Triage Notes (Signed)
Pt via POV from home. Pt c/o NV that started today. Pt states that he can't keep anything down. States that he doesn't have anything on his stomach. Denies abd pain. Denies urinary symptoms. Denies fevers. States he has a hx of appendectomy. Pt is A&OX4 and NAD

## 2022-07-13 ENCOUNTER — Other Ambulatory Visit: Payer: Self-pay

## 2022-07-13 ENCOUNTER — Emergency Department
Admission: EM | Admit: 2022-07-13 | Discharge: 2022-07-13 | Disposition: A | Discharge status: Home / Self Care | Payer: Self-pay | Attending: Emergency Medicine | Admitting: Emergency Medicine

## 2022-07-13 ENCOUNTER — Encounter: Payer: Self-pay | Admitting: Emergency Medicine

## 2022-07-13 DIAGNOSIS — K047 Periapical abscess without sinus: Secondary | ICD-10-CM | POA: Insufficient documentation

## 2022-07-13 MED ORDER — AMOXICILLIN 875 MG PO TABS: 875.0000 mg | ORAL_TABLET | Freq: Two times a day (BID) | ORAL | 0 refills | Status: DC

## 2022-07-13 NOTE — Discharge Instructions (Signed)
Dental clinics listed on your discharge papers for an appointment.  Also a small paper is stable to your discharge papers which are dental clinics that you do not have to have an appointment for.  Walk-in hours are started for [employer], [facility] and [place].  OPTIONS FOR DENTAL FOLLOW UP CARE  [place] [facility] - Local Safety Net Dental Clinics [email or web address]   [facility] [phone])  [facility] [phone])  [place] [phone])  [facility] [phone])  [facility] [phone]) This clinic caters to the indigent population and is on a lottery system. Location: [employer] [facility], [employer], [address], [place] Clinic Hours: Wednesdays from 6pm - 9pm, patients seen by a lottery system. For dates, call or go to [email or web address] Services: Cleanings, fillings and simple extractions. Payment Options: DENTAL WORK IS FREE OF CHARGE. Bring proof of income or support. Best way to get seen: Arrive at 5:15 pm - this is a lottery, NOT first come/first serve, so arriving earlier will not increase your chances of being seen.     [facility] [phone] Select option 1 for emergencies   Location: [facility], [place], [address] [place] Clinic Hours: No walk-ins accepted - call the day before to schedule an appointment. Check in times are 9:30 am and 1:30 pm. Services: Simple extractions, temporary fillings, pulpectomy/pulp debridement, uncomplicated abscess drainage. Payment Options: PAYMENT IS DUE AT THE TIME OF SERVICE.  Fee is usually $100-200, additional surgical procedures (e.g. abscess drainage) may be extra. Cash, checks, Visa/MasterCard accepted.  Can file Medicaid if patient is covered for dental - patient should  call case worker to check. No discount for [facility] patients. Best way to get seen: MUST call the day before and get onto the schedule. Can usually be seen the next 1-2 days. No walk-ins accepted.     [facility] [phone]   Location: [facility], [address] [place] Clinic Hours: M, W, Th, F 8am or 1:30pm, Tues 9a or 1:30 - first come/first served. Services: Simple extractions, temporary fillings, uncomplicated abscess drainage.  You do not need to be an [facility] resident. Payment Options: PAYMENT IS DUE AT THE TIME OF SERVICE. Dental insurance, otherwise sliding scale - bring proof of income or support. Depending on income and treatment needed, cost is usually $50-200. Best way to get seen: Arrive early as it is first come/first served.     [facility] [phone]   Location: [address] Clinic Hours: Mon-Thu 8a-5p Services: Most basic dental services including extractions and fillings. Payment Options: PAYMENT IS DUE AT THE TIME OF SERVICE. Sliding scale, up to 50% off - bring proof if income or support. Medicaid with dental option accepted. Best way to get seen: Call to schedule an appointment, can usually be seen within 2 weeks OR they will try to see walk-ins - show up at 8a or 2p (you may have to wait).     [facility] [phone] [place] RESIDENTS ONLY   Location: [facility], [address] [place], [place] [address] Clinic Hours: By appointment only. Monday - Thursday 8am-5pm, Friday 8am-12pm Services: Cleanings, fillings, extractions. Payment Options: PAYMENT IS DUE AT THE TIME OF SERVICE. Cash, Visa or MasterCard. Sliding scale - $30 minimum per service. Best way to get seen: Come in to office,  complete packet and make an appointment - need proof of income or support monies for each household member and  proof of [facility] residence. Usually takes about a month to get in.     [facility] [phone]   Location: [address], [facility] Clinic Hours: Walk-in Urgent Care Dental Services are offered Monday-Friday mornings only. The numbers of emergencies accepted daily is limited to the number of providers available. Maximum 15 - Mondays, Wednesdays & Thursdays Maximum 10 - Tuesdays & Fridays Services: You do not need to be a [facility] resident to be seen for a dental emergency. Emergencies are defined as pain, swelling, abnormal bleeding, or dental trauma. Walkins will receive x-rays if needed. NOTE: Dental cleaning is not an emergency. Payment Options: PAYMENT IS DUE AT THE TIME OF SERVICE. Minimum co-pay is $40.00 for uninsured patients. Minimum co-pay is $3.00 for Medicaid with dental coverage. Dental Insurance is accepted and must be presented at time of visit. Medicare does not cover dental. Forms of payment: Cash, credit card, checks. Best way to get seen: If not previously registered with the clinic, walk-in dental registration begins at 7:15 am and is on a first come/first serve basis. If previously registered with the clinic, call to make an appointment.     [facility] [phone] [place] RESIDENTS ONLY   Location: [address], [place], [place] Clinic Hours: Mon-Thu 10a-2p Services: Extractions only! Payment Options: FREE (donations accepted) - bring proof of income or support Best way to get seen: Call and schedule an appointment OR come at 8am on the 1st Monday of every month (except for holidays) when it is first come/first served.     [facility] [phone]   Location: [address] [place], [place] Clinic Hours: Friday mornings Services, Payment Options, Best way to get seen: Call for info

## 2022-07-13 NOTE — ED Triage Notes (Signed)
Pt sts that he has a right upper tooth infection. Pt sts that he is in need of a abx. Pt is unable to afford dental care as he was told it will be one thousand dollars for a tooth extraction.

## 2022-07-13 NOTE — ED Provider Notes (Signed)
[facility] Provider Note    Event Date/Time   First MD Initiated Contact with Patient 07/13/22 1406     (approximate)   History   Dental Pain   HPI  Noah Patel is a 31 y.o. male   presents to the ED with complaint of dental pain.  Patient states that he broke his tooth sometime ago and was told that he needed thousand dollars to have his tooth extracted.  Patient states that he does not have that kind of money.  Currently he complains of increased pain and swelling around the tooth.      Physical Exam   Triage Vital Signs: ED Triage Vitals  Enc Vitals Group     BP 07/13/22 1346 133/83     Pulse Rate 07/13/22 1346 64     Resp 07/13/22 1346 18     Temp 07/13/22 1346 97.7 F (36.5 C)     Temp Source 07/13/22 1346 Oral     SpO2 07/13/22 1346 98 %     Weight 07/13/22 1346 150 lb (68 kg)     Height 07/13/22 1359 5\' 9"  (1.753 m)     Head Circumference --      Peak Flow --      Pain Score 07/13/22 1346 5     Pain Loc --      Pain Edu? --      Excl. in GC? --     Most recent vital signs: Vitals:   07/13/22 1346  BP: 133/83  Pulse: 64  Resp: 18  Temp: 97.7 F (36.5 C)  SpO2: 98%     General: Awake, no distress.  CV:  Good peripheral perfusion.  Resp:  Normal effort.  Abd:  No distention.  Other:  Examination of the right upper gums there are multiple decayed teeth that are either within the gum or even with the gum.  There is no active drainage.  Gums are edematous.  The most anterior molar that is visible is black.   ED Results / Procedures / Treatments   Labs (all labs ordered are listed, but only abnormal results are displayed) Labs Reviewed - No data to display   PROCEDURES:  Critical Care performed:   Procedures   MEDICATIONS ORDERED IN ED: Medications - No data to display   IMPRESSION / MDM / ASSESSMENT AND PLAN / ED COURSE  I reviewed the triage vital signs and the nursing notes.   Differential diagnosis  includes, but is not limited to, pain due to dental caries, dental abscess, gingivitis.  31 year old male presents to the ED with dental pain and exam that is consistent with a dental abscess.  Patient's teeth are in very poor repair and hygiene.  A prescription for amoxicillin 875 twice daily was sent to the pharmacy for him begin taking and he is encouraged to take Tylenol or ibuprofen as needed for pain.  A list of dental clinics were printed for him to call and make arrangements to be seen including the walk-in clinics that are also available.      Patient's presentation is most consistent with acute, uncomplicated illness.  FINAL CLINICAL IMPRESSION(S) / ED DIAGNOSES   Final diagnoses:  Dental abscess     Rx / DC Orders   ED Discharge Orders          Ordered    amoxicillin (AMOXIL) 875 MG tablet  2 times daily        07/13/22 1421  Note:  This document was prepared using Dragon voice recognition software and may include unintentional dictation errors.   Tommi Rumps, PA-C 07/13/22 1427    Merwyn Katos, MD 07/13/22 1515

## 2022-08-16 ENCOUNTER — Other Ambulatory Visit: Payer: Self-pay

## 2022-08-16 DIAGNOSIS — Z5321 Procedure and treatment not carried out due to patient leaving prior to being seen by health care provider: Secondary | ICD-10-CM | POA: Insufficient documentation

## 2022-08-16 DIAGNOSIS — Z20822 Contact with and (suspected) exposure to covid-19: Secondary | ICD-10-CM | POA: Insufficient documentation

## 2022-08-16 DIAGNOSIS — K920 Hematemesis: Secondary | ICD-10-CM | POA: Insufficient documentation

## 2022-08-16 LAB — RESP PANEL BY RT-PCR (RSV, FLU A&B, COVID)  RVPGX2
Influenza A by PCR: NEGATIVE
Influenza B by PCR: NEGATIVE
Resp Syncytial Virus by PCR: NEGATIVE
SARS Coronavirus 2 by RT PCR: NEGATIVE

## 2022-08-16 LAB — CBC WITH DIFFERENTIAL/PLATELET
Abs Immature Granulocytes: 0.04 10*3/uL (ref 0.00–0.07)
Basophils Absolute: 0 10*3/uL (ref 0.0–0.1)
Basophils Relative: 0 %
Eosinophils Absolute: 0 10*3/uL (ref 0.0–0.5)
Eosinophils Relative: 0 %
HCT: 48.2 % (ref 39.0–52.0)
Hemoglobin: 16.1 g/dL (ref 13.0–17.0)
Immature Granulocytes: 0 %
Lymphocytes Relative: 13 %
Lymphs Abs: 1.7 10*3/uL (ref 0.7–4.0)
MCH: 29.5 pg (ref 26.0–34.0)
MCHC: 33.4 g/dL (ref 30.0–36.0)
MCV: 88.4 fL (ref 80.0–100.0)
Monocytes Absolute: 0.9 10*3/uL (ref 0.1–1.0)
Monocytes Relative: 7 %
Neutro Abs: 10.6 10*3/uL — ABNORMAL HIGH (ref 1.7–7.7)
Neutrophils Relative %: 80 %
Platelets: 339 10*3/uL (ref 150–400)
RBC: 5.45 MIL/uL (ref 4.22–5.81)
RDW: 12.8 % (ref 11.5–15.5)
WBC: 13.2 10*3/uL — ABNORMAL HIGH (ref 4.0–10.5)
nRBC: 0 % (ref 0.0–0.2)

## 2022-08-16 NOTE — ED Notes (Signed)
No answer when called for re vitals.

## 2022-08-16 NOTE — ED Triage Notes (Signed)
Pt states coming in with body chills, emesis, and nausea for the last 3 days. Pt states some blood in his emesis.  Pt states he is coming off of fentanyl. Pt states last dose was 3 days ago.

## 2022-08-17 ENCOUNTER — Emergency Department
Admission: EM | Admit: 2022-08-17 | Discharge: 2022-08-17 | Discharge status: Against Medical Advice | Payer: Self-pay | Attending: Emergency Medicine | Admitting: Emergency Medicine

## 2022-08-17 NOTE — ED Notes (Signed)
NA in lobby when called for repeat vitals

## 2024-02-10 ENCOUNTER — Other Ambulatory Visit: Payer: Self-pay

## 2024-02-10 ENCOUNTER — Encounter (HOSPITAL_COMMUNITY): Payer: Self-pay

## 2024-02-10 ENCOUNTER — Emergency Department (HOSPITAL_COMMUNITY)
Admission: EM | Admit: 2024-02-10 | Discharge: 2024-02-10 | Disposition: A | Discharge status: Home / Self Care | Payer: MEDICAID | Attending: Emergency Medicine | Admitting: Emergency Medicine

## 2024-02-10 DIAGNOSIS — K0889 Other specified disorders of teeth and supporting structures: Secondary | ICD-10-CM | POA: Insufficient documentation

## 2024-02-10 DIAGNOSIS — K029 Dental caries, unspecified: Secondary | ICD-10-CM

## 2024-02-10 DIAGNOSIS — K047 Periapical abscess without sinus: Secondary | ICD-10-CM

## 2024-02-10 MED ORDER — IBUPROFEN 600 MG PO TABS
600.0000 mg | ORAL_TABLET | Freq: Four times a day (QID) | ORAL | 0 refills | Status: AC | PRN
Start: 2024-02-10 — End: ?

## 2024-02-10 MED ORDER — AMOXICILLIN 500 MG PO CAPS: 500.0000 mg | ORAL_CAPSULE | Freq: Three times a day (TID) | ORAL | 0 refills | Status: AC

## 2024-02-10 NOTE — ED Provider Notes (Signed)
 [place] EMERGENCY DEPARTMENT AT [facility] Provider Note   CSN: [id number] Arrival date & time: 02/10/24  1306     Patient presents with: Dental Pain   Noah Patel is a 33 y.o. male who presents with a 3-day history of increasing swelling and pain to the left mandible and left side of the neck.  He has multiple fractured teeth and poor dentition, has not been following with dentistry as he is just recently gained access to insurance.  Seeking treatment for pain and for likely dental abscess.    Dental Pain      Prior to Admission medications   Medication Sig Start Date End Date Taking? Authorizing Provider  amoxicillin  (AMOXIL ) 875 MG tablet Take 1 tablet (875 mg total) by mouth 2 (two) times daily. 07/13/22   Saunders Shona LITTIE, PA-C  fluticasone  (FLONASE ) 50 MCG/ACT nasal spray Place 1 spray into both nostrils 2 (two) times daily. 09/30/21   Cuthriell, Quintyn Dombek D, PA-C  naloxone  (NARCAN ) nasal spray 4 mg/0.1 mL For any concern for opioid overdose 12/03/21   Smith, Zachary P, MD  neomycin-polymyxin-pramoxine (NEOSPORIN  PLUS) 1 % cream Apply topically 2 (two) times daily. 03/29/20   Claudene Tanda POUR, PA-C  ondansetron  (ZOFRAN -ODT) 4 MG disintegrating tablet Take 1 tablet (4 mg total) by mouth every 8 (eight) hours as needed for nausea or vomiting. 09/30/21   Cuthriell, Dorn BIRCH, PA-C    Allergies: Patient has no known allergies.    Review of Systems  HENT:  Positive for dental problem.   Gastrointestinal:  Negative for nausea and vomiting.  All other systems reviewed and are negative.   Updated Vital Signs BP (!) 142/86 (BP Location: Left Arm)   Pulse (!) 101   Temp 98.3 F (36.8 C) (Oral)   Resp 18   Ht 5' 9 (1.753 m)   Wt 86.2 kg   SpO2 100%   BMI 28.06 kg/m   Physical Exam Vitals and nursing note reviewed.  Constitutional:      General: He is not in acute distress.    Appearance: He is well-developed.  HENT:     Head: Normocephalic and atraumatic.      Right Ear: Hearing, tympanic membrane, ear canal and external ear normal.     Left Ear: Hearing, tympanic membrane, ear canal and external ear normal.   Eyes:     Conjunctiva/sclera: Conjunctivae normal.   Neck:     Thyroid: No thyromegaly or thyroid tenderness.     Trachea: Trachea and phonation normal.     Comments: Swelling and tenderness to left sternocleidomastoid, no mastoid tenderness Cardiovascular:     Rate and Rhythm: Normal rate and regular rhythm.     Heart sounds: No murmur heard. Pulmonary:     Effort: Pulmonary effort is normal. No respiratory distress.     Breath sounds: Normal breath sounds.  Abdominal:     Palpations: Abdomen is soft.     Tenderness: There is no abdominal tenderness.   Musculoskeletal:        General: No swelling.     Cervical back: Normal range of motion and neck supple. Edema present. No rigidity. Normal range of motion.   Skin:    General: Skin is warm and dry.     Capillary Refill: Capillary refill takes less than 2 seconds.   Neurological:     Mental Status: He is alert.   Psychiatric:        Mood and Affect: Mood normal.     (  all labs ordered are listed, but only abnormal results are displayed) Labs Reviewed - No data to display  EKG: None  Radiology: No results found.   Procedures   Medications Ordered in the ED - No data to display                                  Medical Decision Making  Medical Decision Making:   Noah Patel is a 33 y.o. male who presented to the ED today with concerns for dental pain, neck swelling detailed above.     Complete initial physical exam performed, notably the patient  was alert and oriented in no apparent distress.  He has multiple fractured teeth and poor dentition, further had tenderness to the left sternocleidomastoid.     Reviewed and confirmed nursing documentation for past medical history, family history, social history.    Initial Assessment:   With the patient's  presentation of dental pain, most likely diagnosis is dental infection/abscess.   Initial Plan:  Given his clinical presentation further workup is deferred at this time.  Reassessment and Plan:   Given clinical presentation, believe this patient has a dental abscess and infection and will treat for same.  Outpatient prescription was given for oral amoxicillin  along with ibuprofen  for pain management.  Referrals to be given for dental follow-up.       Final diagnoses:  None    ED Discharge Orders     None          Myriam Dorn BROCKS, [place] 02/10/24 1433    Doretha Folks, MD 02/12/24 2054

## 2024-02-10 NOTE — ED Triage Notes (Signed)
 Patient presented to ER with swelling to L chin/neck. Patient endorses having broken tooth on that side, history of tooth infection.

## 2024-03-03 ENCOUNTER — Emergency Department (HOSPITAL_COMMUNITY)
Admission: EM | Admit: 2024-03-03 | Discharge: 2024-03-03 | Disposition: A | Discharge status: Home / Self Care | Payer: MEDICAID | Attending: Emergency Medicine | Admitting: Emergency Medicine

## 2024-03-03 DIAGNOSIS — K0889 Other specified disorders of teeth and supporting structures: Secondary | ICD-10-CM | POA: Diagnosis present

## 2024-03-03 MED ORDER — AMOXICILLIN-POT CLAVULANATE 875-125 MG PO TABS
1.0000 | ORAL_TABLET | Freq: Two times a day (BID) | ORAL | 0 refills | Status: DC
Start: 2024-03-03 — End: 2024-03-03

## 2024-03-03 MED ORDER — AMOXICILLIN-POT CLAVULANATE 875-125 MG PO TABS
1.0000 | ORAL_TABLET | Freq: Once | ORAL | Status: AC
  Administered 2024-03-03: 1 via ORAL
  Filled 2024-03-03: qty 1

## 2024-03-03 MED ORDER — AMOXICILLIN-POT CLAVULANATE 875-125 MG PO TABS: 1.0000 | ORAL_TABLET | Freq: Two times a day (BID) | ORAL | 0 refills | Status: AC

## 2024-03-03 NOTE — Discharge Instructions (Signed)
 Return for any problem.  ?

## 2024-03-03 NOTE — ED Triage Notes (Signed)
 Pt arrived reporting dental pain, right side started a few days ago. Swelling noted to R side. States has not been able to see dentist. No other issues reported. Denies diff breathing or shob.

## 2024-03-03 NOTE — ED Provider Notes (Signed)
 [place] EMERGENCY DEPARTMENT AT [facility] Provider Note   CSN: [id number] Arrival date & time: 03/03/24  1355     Patient presents with: Dental Problem and Dental Pain   Noah Patel is a 33 y.o. male.   33 year old male with prior medical history as detailed below presents for evaluation.  Patient with longstanding history of poor dentition.  Patient complains of pain and swelling to the right lower jaw.  This began over the last 24 hours.  He denies fever.  He reports that he is working on getting into see a Education officer, community.  The history is provided by the patient and medical records.       Prior to Admission medications   Medication Sig Start Date End Date Taking? Authorizing Provider  amoxicillin  (AMOXIL ) 500 MG capsule Take 1 capsule (500 mg total) by mouth 3 (three) times daily. 02/10/24   Myriam Dorn BROCKS, PA  fluticasone  (FLONASE ) 50 MCG/ACT nasal spray Place 1 spray into both nostrils 2 (two) times daily. 09/30/21   Cuthriell, Jonathan D, PA-C  ibuprofen  (ADVIL ) 600 MG tablet Take 1 tablet (600 mg total) by mouth every 6 (six) hours as needed. 02/10/24   Myriam Dorn BROCKS, PA  naloxone  (NARCAN ) nasal spray 4 mg/0.1 mL For any concern for opioid overdose 12/03/21   Smith, Zachary P, MD  neomycin-polymyxin-pramoxine (NEOSPORIN  PLUS) 1 % cream Apply topically 2 (two) times daily. 03/29/20   Claudene Tanda POUR, PA-C  ondansetron  (ZOFRAN -ODT) 4 MG disintegrating tablet Take 1 tablet (4 mg total) by mouth every 8 (eight) hours as needed for nausea or vomiting. 09/30/21   Cuthriell, Dorn BIRCH, PA-C    Allergies: Patient has no known allergies.    Review of Systems  All other systems reviewed and are negative.   Updated Vital Signs BP (!) 132/93 (BP Location: Left Arm)   Pulse 65   Temp 98 F (36.7 C) (Oral)   Resp 18   Ht 5' 9 (1.753 m)   SpO2 98%   BMI 28.06 kg/m   Physical Exam Vitals and nursing note reviewed.  Constitutional:      General: He is not in  acute distress.    Appearance: Normal appearance. He is well-developed.  HENT:     Head: Normocephalic and atraumatic.     Comments: Extensive diffuse dental decay.  No dental abscess noted. Eyes:     Conjunctiva/sclera: Conjunctivae normal.     Pupils: Pupils are equal, round, and reactive to light.  Cardiovascular:     Rate and Rhythm: Normal rate and regular rhythm.     Heart sounds: Normal heart sounds.  Pulmonary:     Effort: Pulmonary effort is normal. No respiratory distress.     Breath sounds: Normal breath sounds.  Abdominal:     General: There is no distension.     Palpations: Abdomen is soft.     Tenderness: There is no abdominal tenderness.  Musculoskeletal:        General: No deformity. Normal range of motion.     Cervical back: Normal range of motion and neck supple.  Skin:    General: Skin is warm and dry.  Neurological:     General: No focal deficit present.     Mental Status: He is alert and oriented to person, place, and time.     (all labs ordered are listed, but only abnormal results are displayed) Labs Reviewed - No data to display  EKG: None  Radiology: No results found.  Procedures   Medications Ordered in the ED - No data to display                                  Medical Decision Making Risk Prescription drug management.    Medical Screen Complete  This patient presented to the ED with complaint of dental pain.  This complaint involves an extensive number of treatment options. The initial differential diagnosis includes, but is not limited to, dental infection  This presentation is: Acute, Chronic, Self-Limited, Previously Undiagnosed, Uncertain Prognosis, Complicated, Systemic Symptoms, and Threat to Life/Bodily Function  Patient is presenting with dental pain and associated soft tissue edema.  No evidence of abscess seen on exam.  Patient would benefit from course of antibiotics.  Patient understands need for close  outpatient follow-up with dentistry.   Additional history obtained:  External records from outside sources obtained and reviewed including prior ED visits and prior Inpatient records.   Problem List / ED Course:  Dental pain   Disposition:  After consideration of the diagnostic results and the patients response to treatment, I feel that the patent would benefit from close outpatient followup.       Final diagnoses:  Pain, dental    ED Discharge Orders          Ordered    amoxicillin -clavulanate (AUGMENTIN ) 875-125 MG tablet  Every 12 hours        03/03/24 1417               Laurice Maude BROCKS, MD 03/03/24 1420

## 2024-03-16 ENCOUNTER — Emergency Department (HOSPITAL_COMMUNITY)
Admission: EM | Admit: 2024-03-16 | Discharge: 2024-03-17 | Disposition: A | Discharge status: Home / Self Care | Payer: MEDICAID | Attending: Emergency Medicine | Admitting: Emergency Medicine

## 2024-03-16 ENCOUNTER — Encounter (HOSPITAL_COMMUNITY): Payer: Self-pay | Admitting: Emergency Medicine

## 2024-03-16 ENCOUNTER — Other Ambulatory Visit: Payer: Self-pay

## 2024-03-16 DIAGNOSIS — K2900 Acute gastritis without bleeding: Secondary | ICD-10-CM | POA: Diagnosis not present

## 2024-03-16 DIAGNOSIS — R1010 Upper abdominal pain, unspecified: Secondary | ICD-10-CM | POA: Diagnosis present

## 2024-03-16 LAB — CBC
HCT: 42.2 % (ref 39.0–52.0)
Hemoglobin: 14.2 g/dL (ref 13.0–17.0)
MCH: 30.3 pg (ref 26.0–34.0)
MCHC: 33.6 g/dL (ref 30.0–36.0)
MCV: 90.2 fL (ref 80.0–100.0)
Platelets: 219 K/uL (ref 150–400)
RBC: 4.68 MIL/uL (ref 4.22–5.81)
RDW: 13.3 % (ref 11.5–15.5)
WBC: 8.5 K/uL (ref 4.0–10.5)
nRBC: 0 % (ref 0.0–0.2)

## 2024-03-16 LAB — BASIC METABOLIC PANEL WITH GFR
Anion gap: 13 (ref 5–15)
BUN: 15 mg/dL (ref 6–20)
CO2: 21 mmol/L — ABNORMAL LOW (ref 22–32)
Calcium: 9 mg/dL (ref 8.9–10.3)
Chloride: 104 mmol/L (ref 98–111)
Creatinine, Ser: 0.87 mg/dL (ref 0.61–1.24)
GFR, Estimated: >60 mL/min (ref >60)
Glucose, Bld: 96 mg/dL (ref 70–99)
Potassium: 3.7 mmol/L (ref 3.5–5.1)
Sodium: 138 mmol/L (ref 135–145)

## 2024-03-16 NOTE — ED Provider Notes (Signed)
 [facility] AT [facility] Provider Note   CSN: [id number] Arrival date & time: 03/16/24  2202     Patient presents with: Back Pain and Flank Pain   Noah Patel is a 33 y.o. male who presents with concern for 3 days of bilateral lower back pain that radiates around the right side and across the upper abdomen with associated nausea but no vomiting.  Of note patient has not initiated penicillin  5 days ago for dental procedure that occurred 2 days ago with extraction of 6 teeth.  Patient states his mouth is improving and pain treating only with ibuprofen .  No change in bowel habits; has been a few days since he had a bowel movement but this is normal per patient.  No urinary symptoms such as dysuria frequency or hematuria though patient does feel he is urinating less frequently than typical despite having a primarily soft and liquid diet for his recent dental procedure.  He states his appetite and intake have been normal.  No history of similar abdominal pain in the past.   HPI     Prior to Admission medications   Medication Sig Start Date End Date Taking? Authorizing Provider  omeprazole  (PRILOSEC) 20 MG capsule Take 1 capsule (20 mg total) by mouth 2 (two) times daily before a meal. 03/17/24 04/16/24 Yes Reagyn Facemire R, PA-C  amoxicillin  (AMOXIL ) 500 MG capsule Take 1 capsule (500 mg total) by mouth 3 (three) times daily. 02/10/24   Myriam Dorn BROCKS, PA  amoxicillin -clavulanate (AUGMENTIN ) 875-125 MG tablet Take 1 tablet by mouth every 12 (twelve) hours. 03/03/24   Laurice Maude BROCKS, MD  fluticasone  (FLONASE ) 50 MCG/ACT nasal spray Place 1 spray into both nostrils 2 (two) times daily. 09/30/21   Cuthriell, Jonathan D, PA-C  ibuprofen  (ADVIL ) 600 MG tablet Take 1 tablet (600 mg total) by mouth every 6 (six) hours as needed. 02/10/24   Myriam Dorn BROCKS, PA  naloxone  (NARCAN ) nasal spray 4 mg/0.1 mL For any concern for opioid overdose 12/03/21   Smith,  Zachary P, MD  neomycin-polymyxin-pramoxine (NEOSPORIN  PLUS) 1 % cream Apply topically 2 (two) times daily. 03/29/20   Claudene Tanda POUR, PA-C  ondansetron  (ZOFRAN -ODT) 4 MG disintegrating tablet Take 1 tablet (4 mg total) by mouth every 8 (eight) hours as needed for nausea or vomiting. 09/30/21   Cuthriell, Dorn BIRCH, PA-C    Allergies: Patient has no known allergies.    Review of Systems  Gastrointestinal:  Positive for abdominal pain and nausea. Negative for constipation, diarrhea and vomiting.  Genitourinary:  Positive for flank pain. Negative for decreased urine volume, difficulty urinating, dysuria, enuresis, hematuria, penile discharge, penile pain, penile swelling, scrotal swelling, testicular pain and urgency.    Updated Vital Signs BP 129/80 (BP Location: Right Arm)   Pulse 76   Temp 98.2 F (36.8 C) (Oral)   Resp 16   Ht 5' 10 (1.778 m)   Wt 83.9 kg   SpO2 98%   BMI 26.54 kg/m   Physical Exam Vitals and nursing note reviewed.  Constitutional:      Appearance: He is not ill-appearing or toxic-appearing.  HENT:     Head: Normocephalic and atraumatic.     Mouth/Throat:     Mouth: Mucous membranes are moist.     Pharynx: No oropharyngeal exudate or posterior oropharyngeal erythema.  Eyes:     General:        Right eye: No discharge.  Left eye: No discharge.     Conjunctiva/sclera: Conjunctivae normal.  Cardiovascular:     Rate and Rhythm: Normal rate and regular rhythm.     Pulses: Normal pulses.     Heart sounds: Normal heart sounds. No murmur heard. Pulmonary:     Effort: Pulmonary effort is normal. No respiratory distress.     Breath sounds: Normal breath sounds. No wheezing or rales.  Abdominal:     General: Bowel sounds are normal. There is no distension.     Palpations: Abdomen is soft.     Tenderness: There is abdominal tenderness in the right upper quadrant, epigastric area and periumbilical area. There is right CVA tenderness. There is no left CVA  tenderness, guarding or rebound.  Musculoskeletal:        General: No deformity.     Cervical back: Neck supple.     Right lower leg: No edema.     Left lower leg: No edema.  Skin:    General: Skin is warm and dry.     Capillary Refill: Capillary refill takes less than 2 seconds.  Neurological:     General: No focal deficit present.     Mental Status: He is alert and oriented to person, place, and time. Mental status is at baseline.  Psychiatric:        Mood and Affect: Mood normal.     (all labs ordered are listed, but only abnormal results are displayed) Labs Reviewed  BASIC METABOLIC PANEL WITH GFR - Abnormal; Notable for the following components:      Result Value   CO2 21 (*)    All other components within normal limits  URINALYSIS, ROUTINE W REFLEX MICROSCOPIC  CBC  LIPASE, BLOOD  HEPATIC FUNCTION PANEL    EKG: EKG Interpretation Date/Time:  Saturday March 17 2024 01:54:36 EDT Ventricular Rate:  77 PR Interval:  159 QRS Duration:  77 QT Interval:  360 QTC Calculation: 408 R Axis:   48  Text Interpretation: Sinus rhythm Normal ECG When compared with ECG of 02/07/2022, No significant change was found Confirmed by Raford Lenis ([id number]) on 03/17/2024 1:59:59 AM  Radiology: CT ABDOMEN PELVIS W CONTRAST Result Date: 03/17/2024 CLINICAL DATA:  Abdominal pain, low back and bilateral flank pain EXAM: CT ABDOMEN AND PELVIS WITH CONTRAST TECHNIQUE: Multidetector CT imaging of the abdomen and pelvis was performed using the standard protocol following bolus administration of intravenous contrast. RADIATION DOSE REDUCTION: This exam was performed according to the departmental dose-optimization program which includes automated exposure control, adjustment of the mA and/or kV according to patient size and/or use of iterative reconstruction technique. CONTRAST:  OMNIPAQUE  IOHEXOL  300 MG/ML  SOLN COMPARISON:  None Available. FINDINGS: Lower chest: No acute findings Hepatobiliary:  Diffuse low-density throughout the liver compatible with fatty infiltration. No focal abnormality. Gallbladder unremarkable. Pancreas: No focal abnormality or ductal dilatation. Spleen: No focal abnormality.  Normal size. Adrenals/Urinary Tract: No adrenal abnormality. No focal renal abnormality. No stones or hydronephrosis. Urinary bladder is unremarkable. Stomach/Bowel: Distal gastric wall is thickened compatible with gastritis. Small bowel and large bowel decompressed, unremarkable. Prior appendectomy. Vascular/Lymphatic: No evidence of aneurysm or adenopathy. Reproductive: No visible focal abnormality. Other: No free fluid or free air. Musculoskeletal: No acute bony abnormality. IMPRESSION: Gastric antral wall thickening compatible with gastritis. Hepatic steatosis. Electronically Signed   By: Franky Crease M.D.   On: 03/17/2024 01:31     Procedures   Medications Ordered in the ED  iohexol  (OMNIPAQUE ) 300 MG/ML solution 100  mL (100 mLs Intravenous Contrast Given 03/17/24 0115)  alum & mag hydroxide-simeth (MAALOX/MYLANTA) 200-200-20 MG/5ML suspension 30 mL (30 mLs Oral Given 03/17/24 0324)                                    Medical Decision Making 33 year old male with flank pain and right upper abdominal pain.  Normal vitals on intake.  Cardiopulmonary and abdominal exams as above with generalized upper abdominal tenderness palpation without guarding or rebound; ?  CVAT.   Differential diagnosis of epigastric pain includes but is not limited to: Functional or nonulcer dyspepsia (MCC), PUD, GERD, Gastritis, (NSAIDs, alcohol, stress, H. pylori, pernicious anemia), pancreatitis / pancreatic cancer, overeating indigestion (high-fat foods, coffee), drugs (aspirin, antibiotics (eg, macrolides, metronidazole), corticosteroids, digoxin, narcotics, theophylline), gastroparesis, gastric volvulus, gastric cancer, lactose intolerance, malabsorption, parasitic infection (Giardia, Strongyloides, Ascaris),  abdominal hernia, intestinal ischemia, esophageal rupture,  cholelithiasis /choledocholithiasis / cholangitis, hepatitis, ACS, pericarditis, pneumonia.   Amount and/or Complexity of Data Reviewed Labs: ordered.    Details: CBC unremarkable, BMP and hepatic function panel is unremarkable, lipase is normal. Radiology: ordered.    Details: CTAP with gastritis.  Risk OTC drugs. Prescription drug management.  Clinical picture most consistent with gastritis as etiology of patient's symptoms.  Will discharge with PPI and Carafate, close outpatient follow-up.  Patient has recently secured insurance.  Clinical concern for emergent underlying etiology of this patient's symptoms unremarkable ED workup and patient management is exceedingly low.  [place]  voiced understanding of his medical evaluation and treatment plan. Each of their questions answered to their expressed satisfaction.  Return precautions were given.  Patient is well-appearing, stable, and was discharged in good condition.  This chart was dictated using voice recognition software, Dragon. Despite the best efforts of this provider to proofread and correct errors, errors may still occur which can change documentation meaning.       Final diagnoses:  Acute gastritis, presence of bleeding unspecified, unspecified gastritis type    ED Discharge Orders          Ordered    omeprazole  (PRILOSEC) 20 MG capsule  2 times daily before meals        03/17/24 0328               Staphany Ditton, Pleasant SAUNDERS, PA-C 03/17/24 0331    Pamella Ozell LABOR, DO 03/18/24 1837

## 2024-03-16 NOTE — ED Triage Notes (Signed)
  Patient comes in with lower back and bilateral flank pain that has been going on for 3 days.  States it feels harder to urinate but does not hurt.  Denies any blood or discoloration with urine.  Endorses nausea with no vomiting. Afebrile.  Taking ibuprofen  for pain at home but last dose 2 days ago.   Pain 7/10, squeezing.

## 2024-03-17 ENCOUNTER — Emergency Department (HOSPITAL_COMMUNITY): Payer: MEDICAID

## 2024-03-17 LAB — URINALYSIS, ROUTINE W REFLEX MICROSCOPIC
Bilirubin Urine: NEGATIVE
Glucose, UA: NEGATIVE mg/dL
Hgb urine dipstick: NEGATIVE
Ketones, ur: NEGATIVE mg/dL
Leukocytes,Ua: NEGATIVE
Nitrite: NEGATIVE
Protein, ur: NEGATIVE mg/dL
Specific Gravity, Urine: 1.018 (ref 1.005–1.030)
pH: 5 (ref 5.0–8.0)

## 2024-03-17 LAB — HEPATIC FUNCTION PANEL
ALT: 43 U/L (ref 0–44)
AST: 25 U/L (ref 15–41)
Albumin: 3.9 g/dL (ref 3.5–5.0)
Alkaline Phosphatase: 94 U/L (ref 38–126)
Bilirubin, Direct: 0.1 mg/dL (ref 0.0–0.2)
Indirect Bilirubin: 0.9 mg/dL (ref 0.3–0.9)
Total Bilirubin: 1 mg/dL (ref 0.0–1.2)
Total Protein: 7 g/dL (ref 6.5–8.1)

## 2024-03-17 LAB — LIPASE, BLOOD: Lipase: 24 U/L (ref 11–51)

## 2024-03-17 MED ORDER — OMEPRAZOLE 20 MG PO CPDR: 20.0000 mg | DELAYED_RELEASE_CAPSULE | Freq: Two times a day (BID) | ORAL | 0 refills | Status: AC

## 2024-03-17 MED ORDER — ALUM & MAG HYDROXIDE-SIMETH 200-200-20 MG/5ML PO SUSP
30.0000 mL | Freq: Once | ORAL | Status: AC
  Administered 2024-03-17: 30 mL via ORAL
  Filled 2024-03-17: qty 30

## 2024-03-17 MED ORDER — IOHEXOL 300 MG/ML  SOLN
100.0000 mL | Freq: Once | INTRAMUSCULAR | Status: AC | PRN
  Administered 2024-03-17: 100 mL via INTRAVENOUS

## 2024-03-17 NOTE — Discharge Instructions (Signed)
 You are seen in the ER today for abdominal pain.  Your blood work was reassuring as was your CT scan.  You have some inflammation of the lining of your stomach which is Alm prescribed medication.  Please call and establish care with a primary care doctor for follow-up close in the outpatient setting return to the ER with any new severe symptoms.

## 2024-04-07 ENCOUNTER — Other Ambulatory Visit: Payer: Self-pay

## 2024-04-07 ENCOUNTER — Emergency Department: Payer: MEDICAID

## 2024-04-07 ENCOUNTER — Emergency Department
Admission: EM | Admit: 2024-04-07 | Discharge: 2024-04-08 | Disposition: A | Discharge status: Home / Self Care | Payer: MEDICAID

## 2024-04-07 DIAGNOSIS — R7989 Other specified abnormal findings of blood chemistry: Secondary | ICD-10-CM | POA: Diagnosis not present

## 2024-04-07 DIAGNOSIS — T402X1A Poisoning by other opioids, accidental (unintentional), initial encounter: Secondary | ICD-10-CM | POA: Diagnosis present

## 2024-04-07 DIAGNOSIS — N289 Disorder of kidney and ureter, unspecified: Secondary | ICD-10-CM | POA: Insufficient documentation

## 2024-04-07 DIAGNOSIS — T402X1S Poisoning by other opioids, accidental (unintentional), sequela: Secondary | ICD-10-CM

## 2024-04-07 LAB — COMPREHENSIVE METABOLIC PANEL WITH GFR
ALT: 33 U/L (ref 0–44)
AST: 46 U/L — ABNORMAL HIGH (ref 15–41)
Albumin: 4.3 g/dL (ref 3.5–5.0)
Alkaline Phosphatase: 93 U/L (ref 38–126)
Anion gap: 18 — ABNORMAL HIGH (ref 5–15)
BUN: 17 mg/dL (ref 6–20)
CO2: 15 mmol/L — ABNORMAL LOW (ref 22–32)
Calcium: 8.8 mg/dL — ABNORMAL LOW (ref 8.9–10.3)
Chloride: 104 mmol/L (ref 98–111)
Creatinine, Ser: 2.07 mg/dL — ABNORMAL HIGH (ref 0.61–1.24)
GFR, Estimated: 43 mL/min — ABNORMAL LOW (ref >60)
Glucose, Bld: 217 mg/dL — ABNORMAL HIGH (ref 70–99)
Potassium: 4.6 mmol/L (ref 3.5–5.1)
Sodium: 137 mmol/L (ref 135–145)
Total Bilirubin: 0.9 mg/dL (ref 0.0–1.2)
Total Protein: 7.3 g/dL (ref 6.5–8.1)

## 2024-04-07 LAB — CBC WITH DIFFERENTIAL/PLATELET
Abs Immature Granulocytes: 0.05 K/uL (ref 0.00–0.07)
Basophils Absolute: 0 K/uL (ref 0.0–0.1)
Basophils Relative: 0 %
Eosinophils Absolute: 0 K/uL (ref 0.0–0.5)
Eosinophils Relative: 0 %
HCT: 45.3 % (ref 39.0–52.0)
Hemoglobin: 15 g/dL (ref 13.0–17.0)
Immature Granulocytes: 1 %
Lymphocytes Relative: 11 %
Lymphs Abs: 1 K/uL (ref 0.7–4.0)
MCH: 30.2 pg (ref 26.0–34.0)
MCHC: 33.1 g/dL (ref 30.0–36.0)
MCV: 91.3 fL (ref 80.0–100.0)
Monocytes Absolute: 0.4 K/uL (ref 0.1–1.0)
Monocytes Relative: 4 %
Neutro Abs: 8.3 K/uL — ABNORMAL HIGH (ref 1.7–7.7)
Neutrophils Relative %: 84 %
Platelets: 280 K/uL (ref 150–400)
RBC: 4.96 MIL/uL (ref 4.22–5.81)
RDW: 12.7 % (ref 11.5–15.5)
WBC: 9.8 K/uL (ref 4.0–10.5)
nRBC: 0 % (ref 0.0–0.2)

## 2024-04-07 LAB — ETHANOL: Alcohol, Ethyl (B): <15 mg/dL (ref <15)

## 2024-04-07 LAB — SALICYLATE LEVEL: Salicylate Lvl: <7 mg/dL — ABNORMAL LOW (ref 7.0–30.0)

## 2024-04-07 LAB — ACETAMINOPHEN LEVEL: Acetaminophen (Tylenol), Serum: <10 ug/mL — ABNORMAL LOW (ref 10–30)

## 2024-04-07 MED ORDER — SODIUM CHLORIDE 0.9 % IV BOLUS
1000.0000 mL | Freq: Once | INTRAVENOUS | Status: AC
  Administered 2024-04-07: 1000 mL via INTRAVENOUS

## 2024-04-07 NOTE — ED Notes (Signed)
 [employer] came to deliver death notification of the patients girlfriend.

## 2024-04-07 NOTE — ED Provider Notes (Signed)
 [facility] Provider Note    Event Date/Time   First MD Initiated Contact with Patient 04/07/24 2134     (approximate)   History   Drug Overdose   HPI  Noah Patel is a 33 y.o. male with a history of opioid dependence and substance use who presents via EMS after a unintentional fentanyl overdose.  Patient tells me he has a long history of fentanyl use and used earlier today with his girlfriend in an attempt to get high.  Bystanders apparently called EMS when they were found unresponsive.  Although we do not have confirmation, his girlfriend was possibly found dead at the scene.  Patient was revived with 8 mg of Narcan .  He denies any headache hearing or vision loss, chest pain shortness of breath abdominal pain changes in urinary or bowel habits.  Patient states that he does have an 57 year old son who is living with his grandmother that he cites as a protective factor      Physical Exam   Triage Vital Signs: ED Triage Vitals  Encounter Vitals Group     BP --      Girls Systolic BP Percentile --      Girls Diastolic BP Percentile --      Boys Systolic BP Percentile --      Boys Diastolic BP Percentile --      Pulse Rate 04/07/24 2138 (!) 120     Resp 04/07/24 2138 18     Temp 04/07/24 2138 98 F (36.7 C)     Temp Source 04/07/24 2138 Oral     SpO2 04/07/24 2138 99 %     Weight --      Height 04/07/24 2139 5' 10 (1.778 m)     Head Circumference --      Peak Flow --      Pain Score 04/07/24 2139 0     Pain Loc --      Pain Education --      Exclude from Growth Chart --     Most recent vital signs: Vitals:   04/07/24 2230 04/07/24 2300  BP: (!) 154/82 (!) 142/87  Pulse: (!) 111 97  Resp: (!) 28 14  Temp:    SpO2: 98% 96%    Nursing Triage Note reviewed. Vital signs reviewed and patients oxygen saturation is normoxic  General: Patient is well nourished, well developed, awake and alert, appears mildly diaphoretic Head: Normocephalic  and atraumatic Eyes: Normal inspection, extraocular muscles intact, no conjunctival pallor Pupils 3+ reactive Ear, nose, throat: Normal external exam Neck: Normal range of motion Respiratory: Patient is in no respiratory distress, lungs CTAB Cardiovascular: Patient is tachycardic, RR without murmur appreciated GI: Abd SNT with no guarding or rebound  Back: Normal inspection of the back with good strength and range of motion throughout all ext Extremities: pulses intact with good cap refills, no LE pitting edema or calf tenderness Neuro: The patient is alert and oriented to person, place, and time, appropriately conversive, with 5/5 bilat UE/LE strength, no gross motor or sensory defects noted. Coordination appears to be adequate. Skin: Warm, dry, and intact Psych: normal mood and affect, no SI or HI  ED Results / Procedures / Treatments   Labs (all labs ordered are listed, but only abnormal results are displayed) Labs Reviewed  CBC WITH DIFFERENTIAL/PLATELET - Abnormal; Notable for the following components:      Result Value   Neutro Abs 8.3 (*)    All other  components within normal limits  COMPREHENSIVE METABOLIC PANEL WITH GFR - Abnormal; Notable for the following components:   CO2 15 (*)    Glucose, Bld 217 (*)    Creatinine, Ser 2.07 (*)    Calcium 8.8 (*)    AST 46 (*)    GFR, Estimated 43 (*)    Anion gap 18 (*)    All other components within normal limits  SALICYLATE LEVEL - Abnormal; Notable for the following components:   Salicylate Lvl <7.0 (*)    All other components within normal limits  ACETAMINOPHEN  LEVEL - Abnormal; Notable for the following components:   Acetaminophen  (Tylenol ), Serum <10 (*)    All other components within normal limits  ETHANOL  URINE DRUG SCREEN, QUALITATIVE ([facility] ONLY)     EKG EKG and rhythm strip are interpreted by myself:   EKG: [tachycardic sinus rhythm] at heart rate of 116, normal QRS duration, QTc 434, normal ST segments and T  waves no ectopy EKG not consistent with Acute STEMI Rhythm strip: tachycardic rhythm in lead II   RADIOLOGY Xray chest: No acute abnormalities on my independent review and interpretation and radiologist agrees    PROCEDURES:  Critical Care performed: No  Procedures   MEDICATIONS ORDERED IN ED: Medications  sodium chloride  0.9 % bolus 1,000 mL (has no administration in time range)  sodium chloride  0.9 % bolus 1,000 mL (1,000 mLs Intravenous New Bag/Given 04/07/24 2152)     IMPRESSION / MDM / ASSESSMENT AND PLAN / ED COURSE                                Differential diagnosis includes, but is not limited to, opioid overdose, polysubstance use, aspiration, electrolyte derangement anemia   ED course: Patient arrives and although tachycardic he is satting 98% on room air.  Suspect the tachycardia secondary to the Narcan .  Will pursue blood work to evaluate for electrolyte derangements other substances.  He denies SI or HI currently.  At this time he does not meet any criteria for an involuntary hold.  Unfortunately patient is unaware of of the possibility of his girlfriend's death but we do not have confirmation of such at this time.  Will continue to monitor with the patient   Clinical Course as of 04/07/24 2331  Sat Apr 07, 2024  2154 Patient gave me permission to contact his grandmother but does not know her number.  I attempted the number listed in our system:  [phone], and it was a disconnected number [HD]  2329 Salicylate Lvl(!): <7.0 [HD]  2329 Acetaminophen  (Tylenol ), S(!): <10 Unremarkable [HD]  2330 Comprehensive metabolic panel(!) Patient does have an elevated creatinine.  He received 1 L of IV fluid, will order him a second.  Her urinalysis is still pending [HD]  2330 CBC with Differential(!) Unremarkable [HD]  2331 Patient signed out to oncoming physician pending continued observation to ensure no further doses of fentanyl are needed [HD]    Clinical  Course User Index [HD] Nicholaus Rolland BRAVO, MD    -- Risk: 5 This patient has a high risk of morbidity due to further diagnostic testing or treatment. Rationale: This patient's evaluation and management involve a high risk of morbidity due to the potential severity of presenting symptoms, need for diagnostic testing, and/or initiation of treatment that may require close monitoring. The differential includes conditions with potential for significant deterioration or requiring escalation of care. Treatment decisions in  the ED, including medication administration, procedural interventions, or disposition planning, reflect this level of risk. Additional Support: -- Drug therapy requiring intensive monitoring for toxicity [ ]  -- Decision regarding elective major surgery with idenitified patient or procedure risk factors [ ]  -- Decision regarding hospitalization or escalation of hospital-level care [ ]  -- Decision not to resuscitate or to de-escalate care because of poor prognosis [ ]  -- Parental controlled substances [ ]   COPA: 5 The patient has a severe exacerbation, progression, or side effect of treatment of the following illness/illnesses: []  OR  The patient has the following acute or chronic illness/injury that poses a possible threat to life or bodily function: [X] : The patient has a potentially serious acute condition or an acute exacerbation of a chronic illness requiring urgent evaluation and management in the Emergency Department. The clinical presentation necessitates immediate consideration of life-threatening or function-threatening diagnoses, even if they are ultimately ruled out.  Data(2/3 categories following were performed): 5 I reviewed or ordered at least three unique tests, external notes, and/or the history required an independent historian as one of the three requirements as following: CBC, CMP, salicylate level AND  I independently interpreted the following test: X-ray of  chest OR  I discussed the management of the patient with the following external physician or qualified healthcare provider: []     Suggested E/M Coding Level: 5, 99285, This has been selected based on the 05-05-22 CPT guidelines for E/M codes in the Emergency Department based on 2/3 of the CoPA, Data, and Risk.    FINAL CLINICAL IMPRESSION(S) / ED DIAGNOSES   Final diagnoses:  Opioid overdose, accidental or unintentional, sequela  Acute renal insufficiency     Rx / DC Orders   ED Discharge Orders     None        Note:  This document was prepared using Dragon voice recognition software and may include unintentional dictation errors.   Nicholaus Rolland BRAVO, MD 04/07/24 [phone]

## 2024-04-07 NOTE — ED Triage Notes (Signed)
 Pt BIB [facility] from home for overdose of fentanyl. GF was DOA on scene. But pt doesnt know that. Pt advsied it was recreational and not suicidal. Pt took 8mg  of narcan  to wake up by [employer].   18 G LAC Pt is now CAOx4, in no acute distress.

## 2024-04-08 LAB — URINE DRUG SCREEN, QUALITATIVE (ARMC ONLY)
Amphetamines, Ur Screen: NOT DETECTED
Barbiturates, Ur Screen: NOT DETECTED
Benzodiazepine, Ur Scrn: NOT DETECTED
Cannabinoid 50 Ng, Ur ~~LOC~~: POSITIVE — AB
Cocaine Metabolite,Ur ~~LOC~~: NOT DETECTED
MDMA (Ecstasy)Ur Screen: NOT DETECTED
Methadone Scn, Ur: NOT DETECTED
Opiate, Ur Screen: NOT DETECTED
Phencyclidine (PCP) Ur S: NOT DETECTED
Tricyclic, Ur Screen: NOT DETECTED

## 2024-04-08 NOTE — ED Notes (Signed)
 Patient awake. Very somnolent from events last night. Patient reports his girlfriend and he were snorting fentanyl last night and both passed out. He woke up and couldn't find his girlfriend then found her and attempted to get her narcan  but couldn't find any, then managed to get to neighbors to call 911. Patient complaining of body aches all over. Still sleepy. Provided gingerale and water. Patient to provide urine sample.

## 2024-04-08 NOTE — ED Provider Notes (Signed)
 Emergency department handoff note  Care of this patient was signed out to me at the end of the previous provider shift.  All pertinent patient information was conveyed and all questions were answered.  Patient pending metabolization to reassessment. The patient has been reexamined and is ready to be discharged.  All diagnostic results have been reviewed and discussed with the patient/family.  Care plan has been outlined and the patient/family understands all current diagnoses, results, and treatment plans.  There are no new complaints, changes, or physical findings at this time.  All questions have been addressed and answered.  Patient was instructed to, and agrees to follow-up with their primary care physician as well as return to the emergency department if any new or worsening symptoms develop.   Mirriam Vadala K, MD 04/08/24 [phone]

## 2024-04-08 NOTE — ED Notes (Signed)
 This RN noticed that pt's O2 sats on the monitor were 88-90 while pt was sleeping. This RN placed pt on 2L [place] and notified ED provider and primary RN. Pt also wanted something to drink. This RN took pt a cup of water after clearing it with ED provider.
